# Patient Record
Sex: Male | Born: 1985 | Race: White | Hispanic: No | Marital: Single | Smoking: Current every day smoker
Health system: Southern US, Community
[De-identification: ages and names within clinical notes are randomized; demographics above are authoritative.]

## PROBLEM LIST (undated history)

## (undated) DIAGNOSIS — G2581 Restless legs syndrome: Secondary | ICD-10-CM

## (undated) DIAGNOSIS — K219 Gastro-esophageal reflux disease without esophagitis: Secondary | ICD-10-CM

## (undated) DIAGNOSIS — B1920 Unspecified viral hepatitis C without hepatic coma: Secondary | ICD-10-CM

## (undated) DIAGNOSIS — F1910 Other psychoactive substance abuse, uncomplicated: Secondary | ICD-10-CM

## (undated) HISTORY — DX: Other psychoactive substance abuse, uncomplicated: F19.10

## (undated) HISTORY — DX: Unspecified viral hepatitis C without hepatic coma: B19.20

## (undated) HISTORY — DX: Restless legs syndrome: G25.81

## (undated) HISTORY — DX: Gastro-esophageal reflux disease without esophagitis: K21.9

---
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
## 2007-01-07 ENCOUNTER — Inpatient Hospital Stay: Payer: Self-pay | Admitting: Psychiatry

## 2007-08-03 ENCOUNTER — Emergency Department: Payer: Self-pay | Admitting: Emergency Medicine

## 2007-08-18 ENCOUNTER — Emergency Department: Payer: Self-pay | Admitting: Emergency Medicine

## 2008-03-17 ENCOUNTER — Emergency Department: Payer: Self-pay | Admitting: Emergency Medicine

## 2009-04-25 ENCOUNTER — Emergency Department: Payer: Self-pay | Admitting: Emergency Medicine

## 2009-04-26 ENCOUNTER — Emergency Department: Payer: Self-pay | Admitting: Internal Medicine

## 2009-06-07 ENCOUNTER — Emergency Department: Payer: Self-pay | Admitting: Emergency Medicine

## 2009-11-11 ENCOUNTER — Emergency Department: Payer: Self-pay | Admitting: Unknown Physician Specialty

## 2010-02-01 ENCOUNTER — Emergency Department: Payer: Self-pay | Admitting: Emergency Medicine

## 2011-09-29 ENCOUNTER — Emergency Department: Payer: Self-pay | Admitting: Emergency Medicine

## 2012-01-12 ENCOUNTER — Emergency Department: Payer: Self-pay | Admitting: Emergency Medicine

## 2012-02-08 ENCOUNTER — Emergency Department: Payer: Self-pay | Admitting: Emergency Medicine

## 2012-07-05 ENCOUNTER — Emergency Department: Payer: Self-pay | Admitting: Emergency Medicine

## 2012-08-03 ENCOUNTER — Emergency Department: Payer: Self-pay | Admitting: Emergency Medicine

## 2013-12-08 ENCOUNTER — Emergency Department: Payer: Self-pay | Admitting: Emergency Medicine

## 2013-12-08 LAB — DRUG SCREEN, URINE
AMPHETAMINES, UR SCREEN: NEGATIVE (ref ?–1000)
BENZODIAZEPINE, UR SCRN: NEGATIVE (ref ?–200)
Barbiturates, Ur Screen: NEGATIVE (ref ?–200)
CANNABINOID 50 NG, UR ~~LOC~~: POSITIVE (ref ?–50)
COCAINE METABOLITE, UR ~~LOC~~: POSITIVE (ref ?–300)
MDMA (ECSTASY) UR SCREEN: NEGATIVE (ref ?–500)
Methadone, Ur Screen: NEGATIVE (ref ?–300)
Opiate, Ur Screen: POSITIVE (ref ?–300)
Phencyclidine (PCP) Ur S: NEGATIVE (ref ?–25)
Tricyclic, Ur Screen: NEGATIVE (ref ?–1000)

## 2013-12-08 LAB — CBC
HCT: 46.6 % (ref 40.0–52.0)
HGB: 16 g/dL (ref 13.0–18.0)
MCH: 32.6 pg (ref 26.0–34.0)
MCHC: 34.3 g/dL (ref 32.0–36.0)
MCV: 95 fL (ref 80–100)
Platelet: 329 10*3/uL (ref 150–440)
RBC: 4.89 10*6/uL (ref 4.40–5.90)
RDW: 12.9 % (ref 11.5–14.5)
WBC: 8.7 10*3/uL (ref 3.8–10.6)

## 2013-12-08 LAB — COMPREHENSIVE METABOLIC PANEL
ALBUMIN: 4.5 g/dL (ref 3.4–5.0)
Alkaline Phosphatase: 81 U/L
Anion Gap: 4 — ABNORMAL LOW (ref 7–16)
BUN: 11 mg/dL (ref 7–18)
Bilirubin,Total: 0.3 mg/dL (ref 0.2–1.0)
CHLORIDE: 102 mmol/L (ref 98–107)
CO2: 32 mmol/L (ref 21–32)
Calcium, Total: 8.8 mg/dL (ref 8.5–10.1)
Creatinine: 0.79 mg/dL (ref 0.60–1.30)
EGFR (African American): >60
Glucose: 96 mg/dL (ref 65–99)
Osmolality: 275 (ref 275–301)
Potassium: 4.2 mmol/L (ref 3.5–5.1)
SGOT(AST): 18 U/L (ref 15–37)
SGPT (ALT): 25 U/L (ref 12–78)
Sodium: 138 mmol/L (ref 136–145)
TOTAL PROTEIN: 7.8 g/dL (ref 6.4–8.2)

## 2013-12-08 LAB — SALICYLATE LEVEL: Salicylates, Serum: 3.4 mg/dL — ABNORMAL HIGH

## 2013-12-08 LAB — ACETAMINOPHEN LEVEL: Acetaminophen: <2 ug/mL

## 2013-12-08 LAB — TSH: Thyroid Stimulating Horm: 1.25 u[IU]/mL

## 2013-12-08 LAB — ETHANOL: Ethanol: <3 mg/dL

## 2014-02-25 ENCOUNTER — Emergency Department: Payer: Self-pay | Admitting: Emergency Medicine

## 2014-02-25 LAB — COMPREHENSIVE METABOLIC PANEL
ALBUMIN: 4 g/dL (ref 3.4–5.0)
ALT: 29 U/L (ref 12–78)
Alkaline Phosphatase: 87 U/L
Anion Gap: 8 (ref 7–16)
BILIRUBIN TOTAL: 0.4 mg/dL (ref 0.2–1.0)
BUN: 7 mg/dL (ref 7–18)
CHLORIDE: 109 mmol/L — AB (ref 98–107)
Calcium, Total: 8.6 mg/dL (ref 8.5–10.1)
Co2: 24 mmol/L (ref 21–32)
Creatinine: 0.74 mg/dL (ref 0.60–1.30)
EGFR (African American): >60
EGFR (Non-African Amer.): >60
GLUCOSE: 97 mg/dL (ref 65–99)
OSMOLALITY: 279 (ref 275–301)
POTASSIUM: 3.4 mmol/L — AB (ref 3.5–5.1)
SGOT(AST): 23 U/L (ref 15–37)
Sodium: 141 mmol/L (ref 136–145)
TOTAL PROTEIN: 7.4 g/dL (ref 6.4–8.2)

## 2014-02-25 LAB — CBC
HCT: 43.3 % (ref 40.0–52.0)
HGB: 15 g/dL (ref 13.0–18.0)
MCH: 32.2 pg (ref 26.0–34.0)
MCHC: 34.8 g/dL (ref 32.0–36.0)
MCV: 93 fL (ref 80–100)
PLATELETS: 326 10*3/uL (ref 150–440)
RBC: 4.67 10*6/uL (ref 4.40–5.90)
RDW: 12.5 % (ref 11.5–14.5)
WBC: 7.5 10*3/uL (ref 3.8–10.6)

## 2014-02-25 LAB — ETHANOL
ETHANOL LVL: 68 mg/dL
Ethanol %: 0.068 % (ref 0.000–0.080)

## 2014-02-25 LAB — ACETAMINOPHEN LEVEL: Acetaminophen: <2 ug/mL

## 2014-02-25 LAB — SALICYLATE LEVEL: Salicylates, Serum: 3.2 mg/dL — ABNORMAL HIGH

## 2014-03-16 ENCOUNTER — Emergency Department: Payer: Self-pay | Admitting: Emergency Medicine

## 2014-03-16 LAB — DRUG SCREEN, URINE
Amphetamines, Ur Screen: NEGATIVE (ref ?–1000)
BARBITURATES, UR SCREEN: NEGATIVE (ref ?–200)
Benzodiazepine, Ur Scrn: NEGATIVE (ref ?–200)
Cannabinoid 50 Ng, Ur ~~LOC~~: POSITIVE (ref ?–50)
Cocaine Metabolite,Ur ~~LOC~~: POSITIVE (ref ?–300)
MDMA (ECSTASY) UR SCREEN: NEGATIVE (ref ?–500)
Methadone, Ur Screen: NEGATIVE (ref ?–300)
Opiate, Ur Screen: POSITIVE (ref ?–300)
PHENCYCLIDINE (PCP) UR S: NEGATIVE (ref ?–25)
Tricyclic, Ur Screen: NEGATIVE (ref ?–1000)

## 2014-03-16 LAB — COMPREHENSIVE METABOLIC PANEL
ALBUMIN: 4 g/dL (ref 3.4–5.0)
ALK PHOS: 82 U/L
ALT: 56 U/L (ref 12–78)
ANION GAP: 6 — AB (ref 7–16)
BUN: 5 mg/dL — ABNORMAL LOW (ref 7–18)
Bilirubin,Total: 0.3 mg/dL (ref 0.2–1.0)
CREATININE: 1.01 mg/dL (ref 0.60–1.30)
Calcium, Total: 8.8 mg/dL (ref 8.5–10.1)
Chloride: 108 mmol/L — ABNORMAL HIGH (ref 98–107)
Co2: 27 mmol/L (ref 21–32)
EGFR (African American): >60
EGFR (Non-African Amer.): >60
Glucose: 94 mg/dL (ref 65–99)
OSMOLALITY: 278 (ref 275–301)
POTASSIUM: 4 mmol/L (ref 3.5–5.1)
SGOT(AST): 55 U/L — ABNORMAL HIGH (ref 15–37)
Sodium: 141 mmol/L (ref 136–145)
TOTAL PROTEIN: 7.4 g/dL (ref 6.4–8.2)

## 2014-03-16 LAB — URINALYSIS, COMPLETE
Bacteria: NONE SEEN
Bilirubin,UR: NEGATIVE
Blood: NEGATIVE
GLUCOSE, UR: NEGATIVE mg/dL (ref 0–75)
KETONE: NEGATIVE
Leukocyte Esterase: NEGATIVE
Nitrite: NEGATIVE
Ph: 5 (ref 4.5–8.0)
Protein: NEGATIVE
RBC,UR: 1 /HPF (ref 0–5)
Specific Gravity: 1.021 (ref 1.003–1.030)
Squamous Epithelial: <1

## 2014-03-16 LAB — CBC
HCT: 47.3 % (ref 40.0–52.0)
HGB: 15.4 g/dL (ref 13.0–18.0)
MCH: 31 pg (ref 26.0–34.0)
MCHC: 32.6 g/dL (ref 32.0–36.0)
MCV: 95 fL (ref 80–100)
Platelet: 307 10*3/uL (ref 150–440)
RBC: 4.97 10*6/uL (ref 4.40–5.90)
RDW: 12.5 % (ref 11.5–14.5)
WBC: 6.5 10*3/uL (ref 3.8–10.6)

## 2014-03-16 LAB — ETHANOL
ETHANOL LVL: 198 mg/dL
Ethanol %: 0.198 % — ABNORMAL HIGH (ref 0.000–0.080)

## 2014-03-16 LAB — SALICYLATE LEVEL: Salicylates, Serum: 4.2 mg/dL — ABNORMAL HIGH

## 2014-03-16 LAB — ACETAMINOPHEN LEVEL: Acetaminophen: <2 ug/mL

## 2014-04-10 ENCOUNTER — Emergency Department: Payer: Self-pay | Admitting: Emergency Medicine

## 2014-04-10 LAB — DRUG SCREEN, URINE
Amphetamines, Ur Screen: NEGATIVE (ref ?–1000)
BARBITURATES, UR SCREEN: NEGATIVE (ref ?–200)
BENZODIAZEPINE, UR SCRN: NEGATIVE (ref ?–200)
CANNABINOID 50 NG, UR ~~LOC~~: POSITIVE (ref ?–50)
Cocaine Metabolite,Ur ~~LOC~~: POSITIVE (ref ?–300)
MDMA (Ecstasy)Ur Screen: NEGATIVE (ref ?–500)
Methadone, Ur Screen: NEGATIVE (ref ?–300)
OPIATE, UR SCREEN: NEGATIVE (ref ?–300)
Phencyclidine (PCP) Ur S: NEGATIVE (ref ?–25)
Tricyclic, Ur Screen: NEGATIVE (ref ?–1000)

## 2014-04-10 LAB — CBC
HCT: 41 % (ref 40.0–52.0)
HGB: 13.3 g/dL (ref 13.0–18.0)
MCH: 31.3 pg (ref 26.0–34.0)
MCHC: 32.4 g/dL (ref 32.0–36.0)
MCV: 97 fL (ref 80–100)
Platelet: 248 10*3/uL (ref 150–440)
RBC: 4.25 10*6/uL — ABNORMAL LOW (ref 4.40–5.90)
RDW: 13.1 % (ref 11.5–14.5)
WBC: 5.3 10*3/uL (ref 3.8–10.6)

## 2014-04-10 LAB — COMPREHENSIVE METABOLIC PANEL
Albumin: 3.5 g/dL (ref 3.4–5.0)
Alkaline Phosphatase: 201 U/L — ABNORMAL HIGH
Anion Gap: 3 — ABNORMAL LOW (ref 7–16)
BILIRUBIN TOTAL: 0.9 mg/dL (ref 0.2–1.0)
BUN: 7 mg/dL (ref 7–18)
Calcium, Total: 8.7 mg/dL (ref 8.5–10.1)
Chloride: 99 mmol/L (ref 98–107)
Co2: 33 mmol/L — ABNORMAL HIGH (ref 21–32)
Creatinine: 0.87 mg/dL (ref 0.60–1.30)
EGFR (African American): >60
EGFR (Non-African Amer.): >60
Glucose: 128 mg/dL — ABNORMAL HIGH (ref 65–99)
OSMOLALITY: 270 (ref 275–301)
Potassium: 4.4 mmol/L (ref 3.5–5.1)
SGOT(AST): 661 U/L — ABNORMAL HIGH (ref 15–37)
SGPT (ALT): 1365 U/L — ABNORMAL HIGH (ref 12–78)
Sodium: 135 mmol/L — ABNORMAL LOW (ref 136–145)
TOTAL PROTEIN: 6.3 g/dL — AB (ref 6.4–8.2)

## 2014-04-10 LAB — URINALYSIS, COMPLETE
Bacteria: NONE SEEN
Bilirubin,UR: NEGATIVE
Glucose,UR: NEGATIVE mg/dL (ref 0–75)
Ketone: NEGATIVE
LEUKOCYTE ESTERASE: NEGATIVE
NITRITE: NEGATIVE
Ph: 7 (ref 4.5–8.0)
Protein: NEGATIVE
SPECIFIC GRAVITY: 1.012 (ref 1.003–1.030)
Squamous Epithelial: NONE SEEN
WBC UR: 1 /HPF (ref 0–5)

## 2014-04-10 LAB — SALICYLATE LEVEL

## 2014-04-10 LAB — ACETAMINOPHEN LEVEL: Acetaminophen: <2 ug/mL

## 2014-04-10 LAB — ETHANOL

## 2014-05-18 LAB — BASIC METABOLIC PANEL
BUN: 6 mg/dL (ref 4–21)
Creatinine: 0.9 mg/dL (ref 0.6–1.3)
GLUCOSE: 91 mg/dL
Sodium: 141 mmol/L (ref 137–147)

## 2014-05-18 LAB — LIPID PANEL
CHOLESTEROL: 133 mg/dL (ref 0–200)
HDL: 58 mg/dL (ref 35–70)
LDL CALC: 59 mg/dL
Triglycerides: 78 mg/dL (ref 40–160)

## 2014-05-18 LAB — HEMOGLOBIN A1C: HEMOGLOBIN A1C: 5.6

## 2014-05-18 LAB — CBC AND DIFFERENTIAL
NEUTROS ABS: 4 /uL
WBC: 6.6 10*3/mL

## 2014-05-18 LAB — TSH: TSH: 1.07 u[IU]/mL (ref 0.41–5.90)

## 2014-06-27 LAB — HEPATIC FUNCTION PANEL
ALT: 100 U/L — AB (ref 10–40)
AST: 69 U/L — AB (ref 14–40)
Alkaline Phosphatase: 77 U/L (ref 25–125)
Bilirubin, Direct: 0.11 mg/dL (ref 0.01–0.4)
Bilirubin, Total: 0.3 mg/dL

## 2014-11-03 ENCOUNTER — Emergency Department: Payer: Self-pay | Admitting: Emergency Medicine

## 2014-11-03 LAB — SALICYLATE LEVEL: SALICYLATES, SERUM: 3.9 mg/dL — AB

## 2014-11-03 LAB — COMPREHENSIVE METABOLIC PANEL
Albumin: 4.4 g/dL (ref 3.4–5.0)
Alkaline Phosphatase: 91 U/L
Anion Gap: 7 (ref 7–16)
BUN: 9 mg/dL (ref 7–18)
Bilirubin,Total: 0.3 mg/dL (ref 0.2–1.0)
Calcium, Total: 8.6 mg/dL (ref 8.5–10.1)
Chloride: 104 mmol/L (ref 98–107)
Co2: 26 mmol/L (ref 21–32)
Creatinine: 0.83 mg/dL (ref 0.60–1.30)
EGFR (African American): >60
Glucose: 92 mg/dL (ref 65–99)
Osmolality: 272 (ref 275–301)
POTASSIUM: 3.7 mmol/L (ref 3.5–5.1)
SGOT(AST): 95 U/L — ABNORMAL HIGH (ref 15–37)
SGPT (ALT): 255 U/L — ABNORMAL HIGH
Sodium: 137 mmol/L (ref 136–145)
Total Protein: 8 g/dL (ref 6.4–8.2)

## 2014-11-03 LAB — DRUG SCREEN, URINE
AMPHETAMINES, UR SCREEN: NEGATIVE (ref ?–1000)
Barbiturates, Ur Screen: NEGATIVE (ref ?–200)
Benzodiazepine, Ur Scrn: NEGATIVE (ref ?–200)
Cannabinoid 50 Ng, Ur ~~LOC~~: POSITIVE (ref ?–50)
Cocaine Metabolite,Ur ~~LOC~~: POSITIVE (ref ?–300)
MDMA (Ecstasy)Ur Screen: NEGATIVE (ref ?–500)
METHADONE, UR SCREEN: NEGATIVE (ref ?–300)
OPIATE, UR SCREEN: NEGATIVE (ref ?–300)
Phencyclidine (PCP) Ur S: NEGATIVE (ref ?–25)
Tricyclic, Ur Screen: NEGATIVE (ref ?–1000)

## 2014-11-03 LAB — ACETAMINOPHEN LEVEL

## 2014-11-03 LAB — CBC
HCT: 44.1 % (ref 40.0–52.0)
HGB: 15.1 g/dL (ref 13.0–18.0)
MCH: 32 pg (ref 26.0–34.0)
MCHC: 34.1 g/dL (ref 32.0–36.0)
MCV: 94 fL (ref 80–100)
PLATELETS: 312 10*3/uL (ref 150–440)
RBC: 4.71 10*6/uL (ref 4.40–5.90)
RDW: 12.3 % (ref 11.5–14.5)
WBC: 9 10*3/uL (ref 3.8–10.6)

## 2014-11-03 LAB — URINALYSIS, COMPLETE
BLOOD: NEGATIVE
Bacteria: NONE SEEN
Bilirubin,UR: NEGATIVE
Glucose,UR: NEGATIVE mg/dL (ref 0–75)
Ketone: NEGATIVE
LEUKOCYTE ESTERASE: NEGATIVE
Nitrite: NEGATIVE
PH: 6 (ref 4.5–8.0)
PROTEIN: NEGATIVE
RBC, UR: NONE SEEN /HPF (ref 0–5)
Specific Gravity: 1.001 (ref 1.003–1.030)
Squamous Epithelial: <1
WBC UR: <1 /HPF (ref 0–5)

## 2014-11-03 LAB — ETHANOL
Ethanol: 101 mg/dL
Ethanol: <3 mg/dL

## 2014-12-14 IMAGING — CT CT HEAD WITHOUT CONTRAST
2 series · 16 of 30 positions shown, 20 images · non-contrast
Comparison: None available for direct review. Report of 02/08/2012
head CT is available.

CLINICAL DATA: Altered mental status. Decreased respirations after
drug use. Left temporal trauma.

EXAM:
CT HEAD WITHOUT CONTRAST
TECHNIQUE: Contiguous axial images were obtained from the base of the skull
through the vertex without intravenous contrast.

[Series 2: head wo · axial · 0.42mm/px · z∈[+388,+518]mm · 14 of 31 slices shown, 18 images]
[im 3/31  brain]
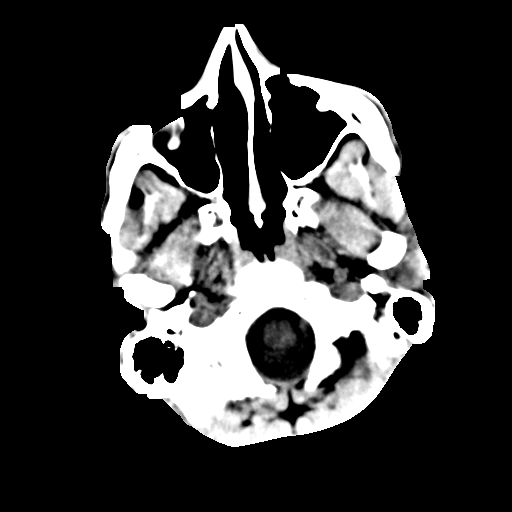
[im 3/31  bone]
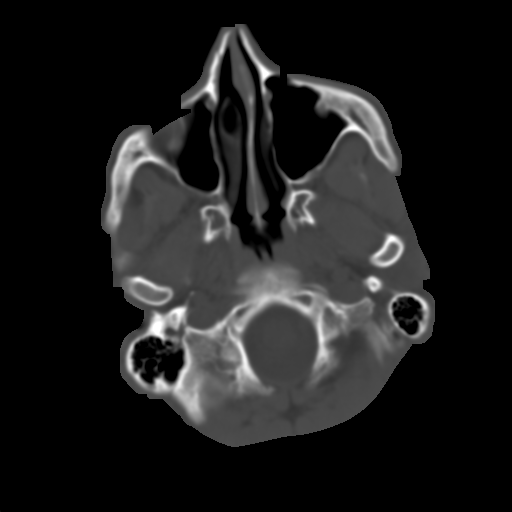
[im 5/31  brain]
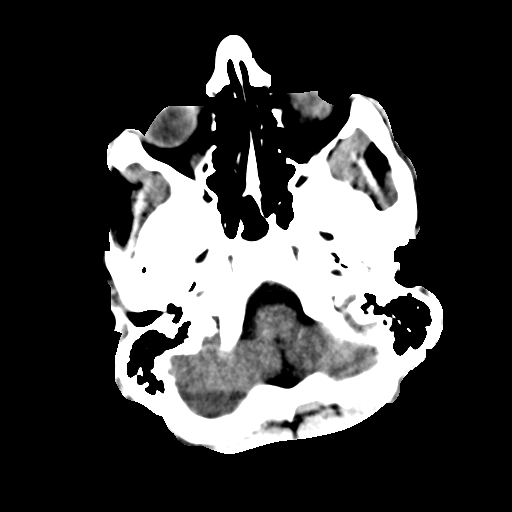
[im 7/31  brain]
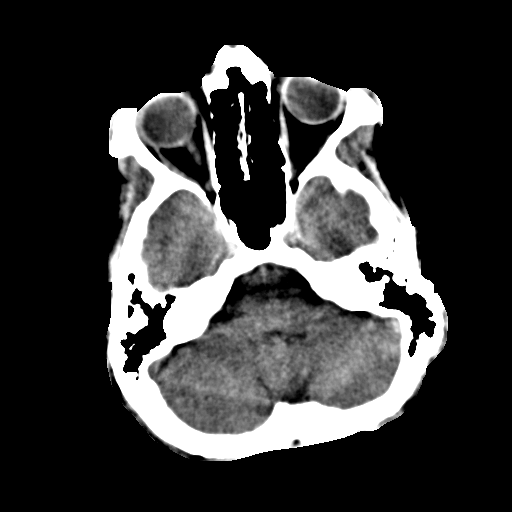
[im 9/31  brain]
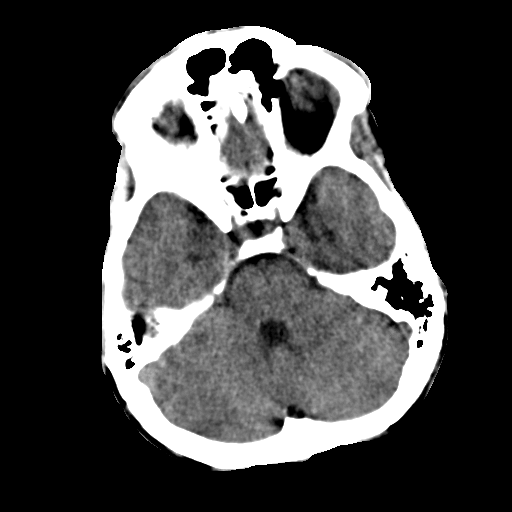
[im 11/31  brain]
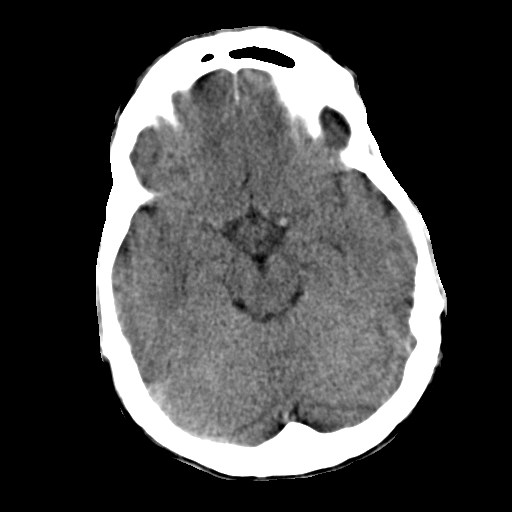
[im 11/31  bone]
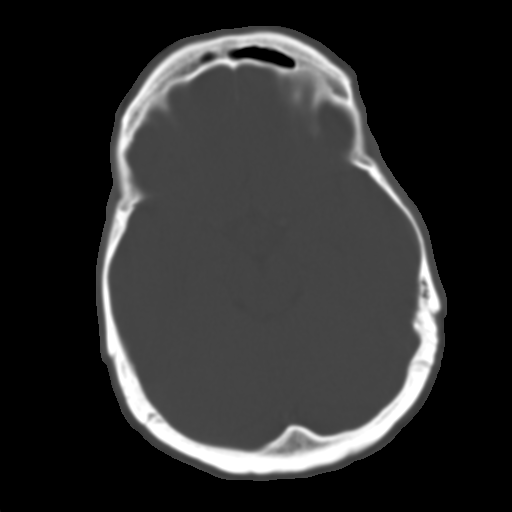
[im 13/31  brain]
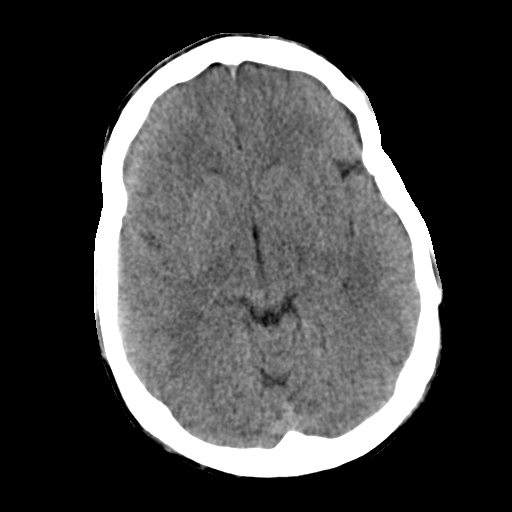
[im 15/31  brain]
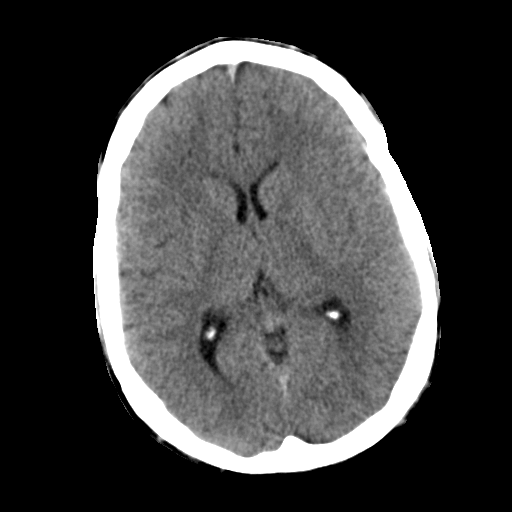
[im 17/31  brain]
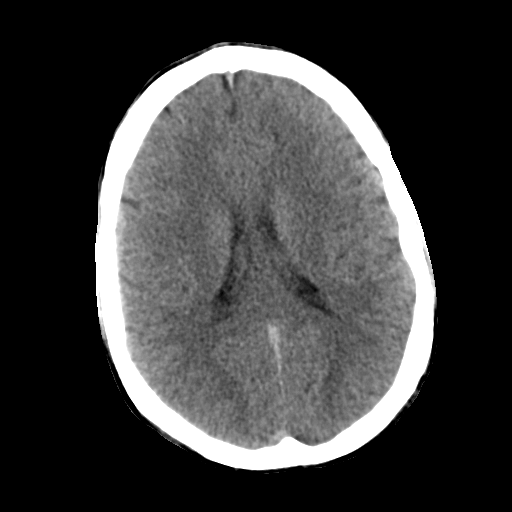
[im 19/31  brain]
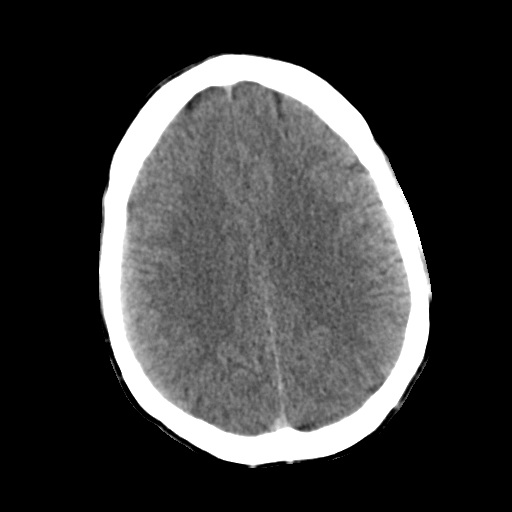
[im 19/31  bone]
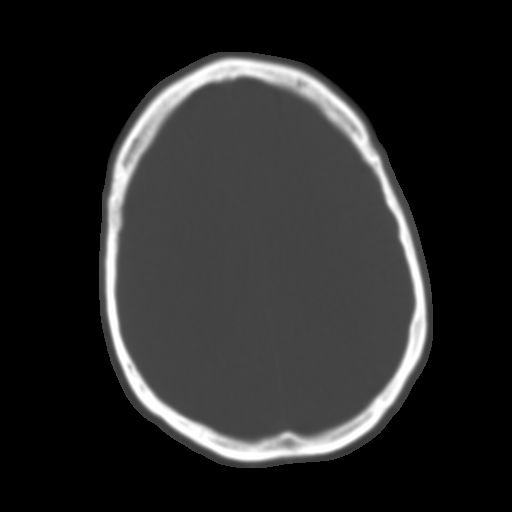
[im 21/31  brain]
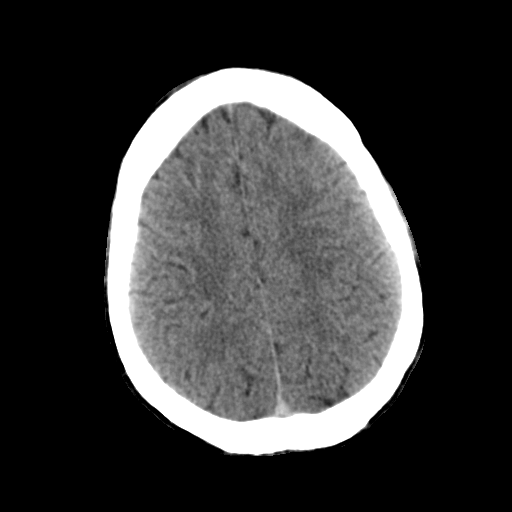
[im 23/31  brain]
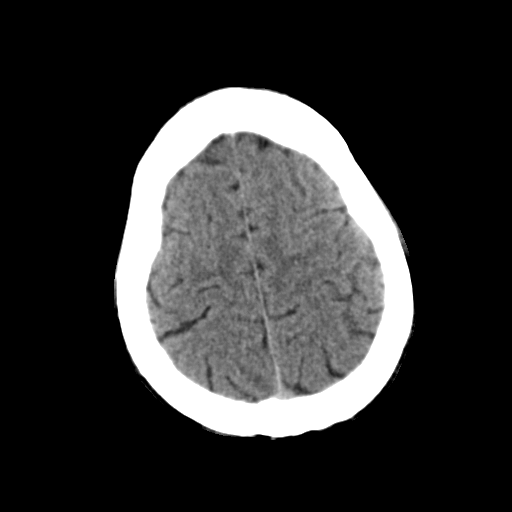
[im 25/31  brain]
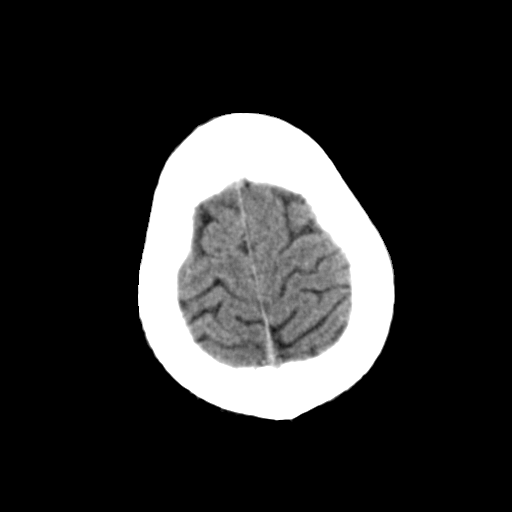
[im 27/31  brain]
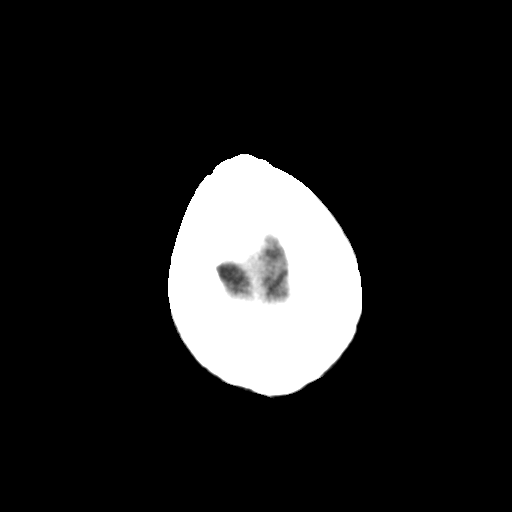
[im 27/31  bone]
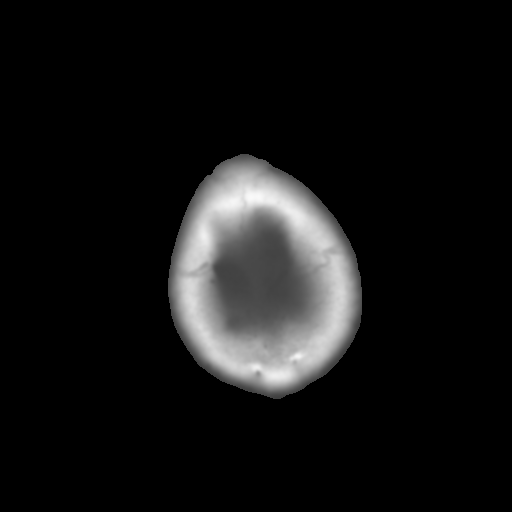
[im 29/31  brain]
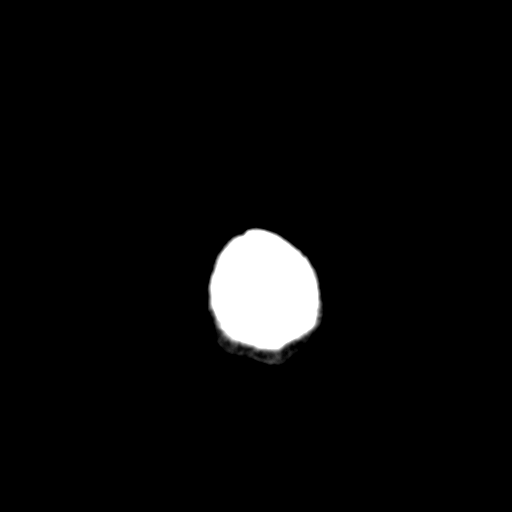

[Series 4: head wo recons · axial · 0.42mm/px · z∈[+433,+451]mm · 2 of 28 slices shown]
[im 3/28  brain]
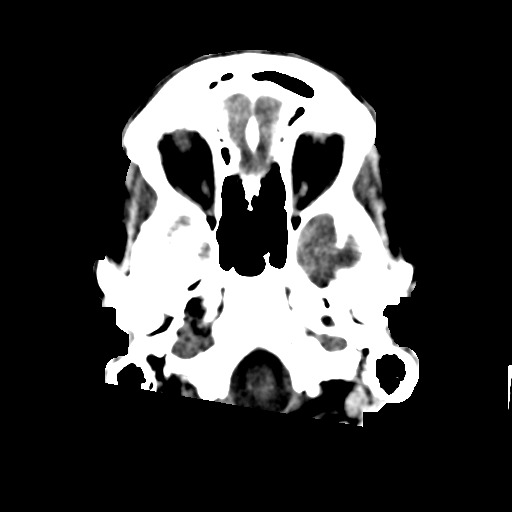
[im 7/28  brain]
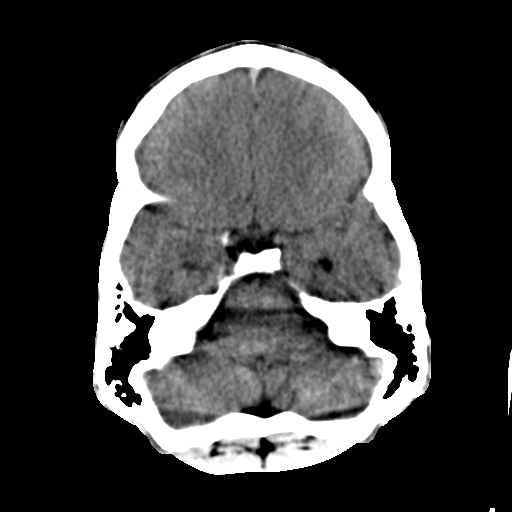

[16 of 30 positions shown; findings below may reference images not displayed]

FINDINGS: No skull fracture or intracranial hemorrhage.

No CT evidence of large acute infarct or diffuse anoxia.

No hydrocephalus.

No intracranial mass lesion noted on this unenhanced exam.
IMPRESSION: No skull fracture or intracranial hemorrhage.

No CT evidence of large acute infarct or diffuse anoxia.

## 2014-12-17 ENCOUNTER — Emergency Department: Payer: Self-pay | Admitting: Emergency Medicine

## 2014-12-19 ENCOUNTER — Emergency Department: Payer: Self-pay | Admitting: Emergency Medicine

## 2014-12-28 ENCOUNTER — Emergency Department: Payer: Self-pay | Admitting: Emergency Medicine

## 2015-02-24 NOTE — Consult Note (Signed)
PATIENT NAME:  Steven Stark, Steven Stark MR#:  [id number] DATE OF BIRTH:  10-07-1986  DATE OF CONSULTATION:  03/16/2014  CONSULTING PHYSICIAN:  Lemya Greenwell S. Garnetta Buddy, MD  REASON FOR CONSULTATION:  Alcohol intoxication.   HISTORY OF PRESENT ILLNESS:  The patient is a 29 year old male who presented to the ER as his friends called 911 after they had concerns about his breathing. They were afraid that the patient was going to die. The patient reported that he was using a lot of alcohol and was consuming approximately 40 ounce as well as 6 packs of beer. He reported that he was asleep and the friend woke him up and he was in the police car. He stated "that I could not breathe, but I was okay." He stated that he uses alcohol as well as cocaine and marijuana on a regular basis. He stated that he was using approximately 1 gram of cocaine IV, as well as marijuana on a daily basis. He has been using cocaine for the past 1 year. The patient reported that "I was not breathing right" and that was the reason his friends were concerned. The patient reported he does not remember that medications were administered to him last night and he was sleeping for the whole day. 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 He reported that he does not have any auditory or visual hallucinations at this time. He reported that his older brother, who is in his 30s, also went to the [facility] recently as he was using any drugs at that time. He stated that he does not feel that he is going into withdrawals and became agitated when he heard about plans to  be sent to [facility] at this time. He currently denied having any perceptual disturbances.   PAST PSYCHIATRIC HISTORY:  The patient reported that he went to the [facility] for rehab at least 2 times in the past. He reported that he is not interested in going to the drug rehab program at this time. He stated that he does  not take any psychotropic medications.   PAST MEDICAL HISTORY:  The patient currently denies having any acute medical problems.   ALLERGIES:  No known drug allergies.   FAMILY HISTORY:  The patient stated strong family history of alcoholism in his family, including his parents, as well as his older brother. He reported that there is no family history of suicide attempts in the family.   CURRENT MEDICATIONS:   None.  SUBSTANCE ABUSE HISTORY:  The patient reported that he has been drinking since he was a teenager. He usually consumes 40 ounces on a daily basis. He also consumes beer along with that. He denied any history of seizures or blackouts. He also mentioned about use of cocaine since he was 17. He usually consumes a gram on a daily basis. He also uses cocaine on an IV basis. He also started using marijuana at the age of 64. He reported consuming approximately a gram of marijuana. He stated that he has tried other drugs in the past.   SOCIAL HISTORY:  The patient reported that he currently lives by himself and works as an Personnel officer at [employer]. He has been working there for the past 6 months. He denied any pending legal charges.   REVIEW OF SYSTEMS:  CONSTITUTIONAL:  Denied any fever or chills. No weight changes.  EYES:  No double or  blurred vision.  RESPIRATORY:  No shortness of breath or cough.  CARDIOVASCULAR:  No chest pain or orthopnea.  GASTROINTESTINAL:  No abdominal pain, nausea, vomiting or diarrhea.  GENITOURINARY:  No incontinence or frequency.  ENDOCRINE: No heat or cold intolerance.  LYMPHATIC:  No anemia or easy bruising.  INTEGUMENTARY:  No acne or rash.  MUSCULOSKELETAL:  No muscle or joint pain.  NEUROLOGIC:  No tingling or weakness.   PHYSICAL EXAMINATION:  VITAL SIGNS: Temperature 96.6, pulse 78, respirations 18, blood pressure 121/68.   LABORATORY DATA:  Glucose 94, BUN 5, creatinine 1.01, sodium 141, potassium 4.0, chloride 108, bicarbonate 27, anion  gap 6, osmolality 278. Blood alcohol level 198. Protein 7.4, albumin 4.0, alkaline phosphatase 82, AST 55, AST 56. UDS positive for cocaine, cannabinoids. WBC 6.5, RBC 4.97, hemoglobin 15.4. Hematocrit is 47.3. Platelet count is 307. MCV is 95. RDW is 12.5.   MENTAL STATUS EXAMINATION:  The patient is a thinly-built male who appeared his stated age. He has a thin body habitus and appears well nourished. His muscle tone appears normal. Gait and station within normal limits. Speech was slow initially and became pressured later on. Thought process was tangential. Loses his patience was noted. Thought content was nondelusional. He denied having any suicidal ideations or plans. Judgment and insight were poor regarding his use of drugs and alcohol. He was awake, alert and oriented x 3. Recent and remote memory were intact. Attention span and concentration were short. Fund of knowledge and language appropriate. Mood was anxious and affect was congruent.   DIAGNOSTIC IMPRESSION: AXIS I: 1.  Bipolar disorder, not otherwise specified.  2.  Polysubstance dependence.  3.  Alcohol withdrawal.  AXIS II:  None.  AXIS III:  None reported.   TREATMENT PLAN: 1.  The patient is currently under involuntary commitment, and he will be given injections of Haldol 5 mg, Ativan 2 mg  at this time, as he became agitated when he heard about his admission to the [facility] and has to be controlled by the security.  2.  Patient will be started on Librium 25 mg p.o. q.6 hours on a regular basis for alcohol withdrawal symptoms.  3.  When he becomes clinically stable, he will be transferred to [facility] for continued care.   Thank you for allowing me to participate in the care of this patient.   ____________________________ Ardeen FillersUzma S. Garnetta BuddyFaheem, MD usf:dmm D: 03/16/2014 16:01:19 ET T: 03/16/2014 17:07:22 ET JOB#: [id number]  cc: Ardeen FillersUzma S. Garnetta BuddyFaheem, MD, <Dictator> Rhunette CroftUZMA S Athea Haley MD ELECTRONICALLY SIGNED 03/23/2014 12:50

## 2015-03-04 NOTE — Consult Note (Signed)
PATIENT NAME:  Steven Stark, Steven Stark MR#:  [id number] DATE OF BIRTH:  [birth year]  DATE OF CONSULTATION:  12/20/2014  REFERRING PHYSICIAN:   CONSULTING PHYSICIAN:  Audery Amel, MD  IDENTIFYING INFORMATION AND REASON FOR CONSULTATION: A 29 year old male with a history of opiate abuse as well as alcohol and marijuana abuse.   CHIEF COMPLAINT: "I already detoxed."   HISTORY OF PRESENT ILLNESS: Information obtained from the patient and the chart. According to the chart, patient came into the hospital very agitated last night trying to elope, stating that he needed Subutex. The patient tells me today that his family had him petitioned and brought over here, but he did not think he needed to be here because he had already detoxed off of drugs. He said he had been here the day before and wanted detoxification and had been turned away but had already stopped using cocaine and heroin. He currently denies any acute symptoms of withdrawal. Denies feeling sick. No shakiness, no aches, no nausea, no GI complaints. He denies any mood symptoms. He says he is not feeling depressed. Sleeps okay appetite is okay. Optimistic about the future, denies suicidal or homicidal ideation. He admits that he had been using heroin, but has not used any heroin or cocaine for 3 days now.   PAST PSYCHIATRIC HISTORY: No history of suicidal or homicidal behavior. Not on any psychiatric medication. He has had substance abuse problems since he was an adolescent, been to several inpatient rehabilitation programs. Says he has been able to maintain some sobriety up to 6 months last year. Was previously been seen at [facility] the [facility] but dropped out because he could not afford it and could not continue to make the meetings.   FAMILY HISTORY: No known family history.   SOCIAL HISTORY: Staying with a friend, works intermittently, looking for work now.   PAST MEDICAL HISTORY: No known medical problems, although he has been tested  positive for hepatitis C, he has not pursued any treatment yet.   CURRENT MEDICATIONS: None.   ALLERGIES: No known drug allergies.   REVIEW OF SYSTEMS: Denies depression, denies suicidal or homicidal ideation; denies hallucinations, no GI, pulmonary or cardiac symptoms.   MENTAL STATUS EXAMINATION: Neatly groomed gentleman who looks his stated age, cooperative with the interview. Eye contact good. Psychomotor activity normal. Speech normal rate, tone and volume. Affect euthymic, reactive, mood stated as okay. Thoughts are lucid without loosening of associations. Denies auditory or visual hallucinations. Denies suicidal or homicidal ideation. He is alert and oriented x 4. Remembers 3/3 objects immediately and 3/3 at 3 minutes. Judgment and insight adequate. Intelligence normal.   LABORATORY RESULTS: Chemistry panel, elevated ALT 173, elevated AST 79, alcohol negative, acetaminophen negative, salicylates slightly elevated but not toxic; CBC normal, urinalysis positive for protein, drug screen positive for cocaine and opiates, alcohol was 143 on February 14, that was the last time he was here in the hospital.   VITAL SIGNS: Blood pressure currently 128/65, respirations 18, pulse 88, temperature 98.9.   ASSESSMENT: A 29 year old male with polysubstance dependence. Currently he appears to be lucid, calm, emotionally stable, no suicidal or homicidal ideation. No acute dangerous behavior. The patient understands the dangers of his substance abuse problem, does not meet commitment criteria.   TREATMENT PLAN: He can be released from commitment. He will be encouraged to go back to [place] and follow up and to go to narcotics anonymous meetings. He agrees to the plan. Psychoeducation completed.   DIAGNOSIS, PRINCIPAL  AND PRIMARY:   Axis I:  Opiate abuse, severe; cocaine abuse, severe; alcohol abuse, severe.   AXIS II: Deferred.   AXIS III:  Hepatitis C.    ____________________________ Audery AmelJohn T.  Ailed Defibaugh, MD jtc:nt D: 12/20/2014 15:53:25 ET T: 12/20/2014 16:09:39 ET JOB#: [id number]  cc: Audery AmelJohn T. Raistlin Gum, MD, <Dictator> Audery AmelJOHN T Liban Guedes MD ELECTRONICALLY SIGNED 01/09/2015 10:33

## 2015-11-13 ENCOUNTER — Emergency Department
Admission: EM | Admit: 2015-11-13 | Discharge: 2015-11-13 | Disposition: A | Discharge status: Home / Self Care | Payer: Self-pay | Attending: Emergency Medicine | Admitting: Emergency Medicine

## 2015-11-13 ENCOUNTER — Encounter: Payer: Self-pay | Admitting: Emergency Medicine

## 2015-11-13 ENCOUNTER — Emergency Department: Payer: Self-pay

## 2015-11-13 DIAGNOSIS — R220 Localized swelling, mass and lump, head: Secondary | ICD-10-CM | POA: Insufficient documentation

## 2015-11-13 DIAGNOSIS — K047 Periapical abscess without sinus: Secondary | ICD-10-CM | POA: Insufficient documentation

## 2015-11-13 DIAGNOSIS — F17200 Nicotine dependence, unspecified, uncomplicated: Secondary | ICD-10-CM | POA: Insufficient documentation

## 2015-11-13 LAB — CBC
HCT: 40.1 % (ref 40.0–52.0)
HEMOGLOBIN: 13.6 g/dL (ref 13.0–18.0)
MCH: 30.6 pg (ref 26.0–34.0)
MCHC: 34 g/dL (ref 32.0–36.0)
MCV: 90 fL (ref 80.0–100.0)
Platelets: 176 10*3/uL (ref 150–440)
RBC: 4.45 MIL/uL (ref 4.40–5.90)
RDW: 12.7 % (ref 11.5–14.5)
WBC: 11.6 10*3/uL — ABNORMAL HIGH (ref 3.8–10.6)

## 2015-11-13 LAB — BASIC METABOLIC PANEL
Anion gap: 10 (ref 5–15)
BUN: 11 mg/dL (ref 6–20)
CHLORIDE: 102 mmol/L (ref 101–111)
CO2: 26 mmol/L (ref 22–32)
CREATININE: 0.85 mg/dL (ref 0.61–1.24)
Calcium: 9.3 mg/dL (ref 8.9–10.3)
GFR calc Af Amer: >60 mL/min (ref >60)
GFR calc non Af Amer: >60 mL/min (ref >60)
Glucose, Bld: 157 mg/dL — ABNORMAL HIGH (ref 65–99)
Potassium: 4.6 mmol/L (ref 3.5–5.1)
SODIUM: 138 mmol/L (ref 135–145)

## 2015-11-13 MED ORDER — BUPIVACAINE HCL 0.5 % IJ SOLN: 50.0000 mL | Freq: Once | INTRAMUSCULAR | Status: DC

## 2015-11-13 MED ORDER — OXYCODONE-ACETAMINOPHEN 5-325 MG PO TABS
2.0000 | ORAL_TABLET | Freq: Once | ORAL | Status: AC
  Administered 2015-11-13: 2 via ORAL
  Filled 2015-11-13: qty 2

## 2015-11-13 MED ORDER — CLINDAMYCIN PHOSPHATE 600 MG/50ML IV SOLN
600.0000 mg | Freq: Once | INTRAVENOUS | Status: AC
  Administered 2015-11-13: 600 mg via INTRAVENOUS
  Filled 2015-11-13: qty 50

## 2015-11-13 MED ORDER — BUPIVACAINE HCL (PF) 0.5 % IJ SOLN
INTRAMUSCULAR | Status: AC
  Administered 2015-11-13: 10 mL
  Filled 2015-11-13: qty 30

## 2015-11-13 MED ORDER — BUPIVACAINE HCL 0.5 % IJ SOLN
30.0000 mL | Freq: Once | INTRAMUSCULAR | Status: AC
  Administered 2015-11-13: 10 mL

## 2015-11-13 MED ORDER — MORPHINE SULFATE (PF) 4 MG/ML IV SOLN
4.0000 mg | Freq: Once | INTRAVENOUS | Status: AC
  Administered 2015-11-13: 4 mg via INTRAVENOUS
  Filled 2015-11-13: qty 1

## 2015-11-13 MED ORDER — CLINDAMYCIN HCL 300 MG PO CAPS: 300.0000 mg | ORAL_CAPSULE | Freq: Three times a day (TID) | ORAL | Status: DC

## 2015-11-13 MED ORDER — CLINDAMYCIN PHOSPHATE 600 MG/50ML IV SOLN
INTRAVENOUS | Status: AC
  Administered 2015-11-13: 600 mg via INTRAVENOUS
  Filled 2015-11-13: qty 50

## 2015-11-13 MED ORDER — IOHEXOL 300 MG/ML  SOLN
75.0000 mL | Freq: Once | INTRAMUSCULAR | Status: AC | PRN
  Administered 2015-11-13: 75 mL via INTRAVENOUS
  Filled 2015-11-13: qty 75

## 2015-11-13 MED ORDER — OXYCODONE-ACETAMINOPHEN 5-325 MG PO TABS: 1.0000 | ORAL_TABLET | Freq: Four times a day (QID) | ORAL | Status: DC | PRN

## 2015-11-13 NOTE — ED Notes (Signed)
Patient transported to CT 

## 2015-11-13 NOTE — ED Notes (Signed)
Pt to ed with c/o right side facial swelling x 2 days.  Pt reports toothache prior to swelling.

## 2015-11-13 NOTE — Discharge Instructions (Signed)
Dental Abscess °A dental abscess is a collection of pus in or around a tooth. °CAUSES °This condition is caused by a bacterial infection around the root of the tooth that involves the inner part of the tooth (pulp). It may result from: °· Severe tooth decay. °· Trauma to the tooth that allows bacteria to enter into the pulp, such as a broken or chipped tooth. °· Severe gum disease around a tooth. °SYMPTOMS °Symptoms of this condition include: °· Severe pain in and around the infected tooth. °· Swelling and redness around the infected tooth, in the mouth, or in the face. °· Tenderness. °· Pus drainage. °· Bad breath. °· Bitter taste in the mouth. °· Difficulty swallowing. °· Difficulty opening the mouth. °· Nausea. °· Vomiting. °· Chills. °· Swollen neck glands. °· Fever. °DIAGNOSIS °This condition is diagnosed with examination of the infected tooth. During the exam, your dentist may tap on the infected tooth. Your dentist will also ask about your medical and dental history and may order X-rays. °TREATMENT °This condition is treated by eliminating the infection. This may be done with: °· Antibiotic medicine. °· A root canal. This may be performed to save the tooth. °· Pulling (extracting) the tooth. This may also involve draining the abscess. This is done if the tooth cannot be saved. °HOME CARE INSTRUCTIONS °· Take medicines only as directed by your dentist. °· If you were prescribed antibiotic medicine, finish all of it even if you start to feel better. °· Rinse your mouth (gargle) often with salt water to relieve pain or swelling. °· Do not drive or operate heavy machinery while taking pain medicine. °· Do not apply heat to the outside of your mouth. °· Keep all follow-up visits as directed by your dentist. This is important. °SEEK MEDICAL CARE IF: °· Your pain is worse and is not helped by medicine. °SEEK IMMEDIATE MEDICAL CARE IF: °· You have a fever or chills. °· Your symptoms suddenly get worse. °· You have a  very bad headache. °· You have problems breathing or swallowing. °· You have trouble opening your mouth. °· You have swelling in your neck or around your eye. °  °This information is not intended to replace advice given to you by your health care provider. Make sure you discuss any questions you have with your health care provider. °  °Document Released: 10/20/2005 Document Revised: 03/06/2015 Document Reviewed: 10/17/2014 °Elsevier Interactive Patient Education ©2016 [employer] ° ° ° ° ° ° °OPTIONS FOR DENTAL FOLLOW UP CARE ° °[place] Department of Health and Human Services - Local Safety Net Dental Clinics °http://www.ncdhhs.gov/dph/oralhealth/services/safetynetclinics.htm °  °[facility] (336-562-3123) ° °[facility] (919-933-9087) ° °[facility] (919-663-1744 ext 237) ° °[facility] (336-570-6415) ° °[facility] (919-968-2025) °This clinic caters to the indigent population and is on a lottery system. °Location: °[facility], [address], [place] °Clinic Hours: °Wednesdays from 6pm - 9pm, patients seen by a lottery system. °For dates, call or go to www.med.[facility].edu/[facility]/patients/Dental-[facility] °Services: °Cleanings, fillings and simple extractions. °Payment Options: °DENTAL WORK IS FREE OF CHARGE. Bring proof of income or support. °Best way to get seen: °Arrive at 5:15 pm - this is a lottery, NOT first come/first serve, so arriving earlier will not increase your chances of being seen. °  °  °[facility] °919-537-3737 °Select option 1 for emergencies °  °Location: °[facility], [address], [place] °  Clinic Hours: °No walk-ins accepted - call the day before to schedule an appointment. °Check in times are 9:30 am and 1:30 pm. °Services: °Simple extractions, temporary fillings, pulpectomy/pulp debridement, uncomplicated abscess drainage. °Payment Options: °PAYMENT IS  DUE AT THE TIME OF SERVICE.  Fee is usually $100-200, additional surgical procedures (e.g. abscess drainage) may be extra. °Cash, checks, Visa/MasterCard accepted.  Can file Medicaid if patient is covered for dental - patient should call case worker to check. °No discount for [facility] patients. °Best way to get seen: °MUST call the day before and get onto the schedule. Can usually be seen the next 1-2 days. No walk-ins accepted. °  °  °[facility] °919-933-9087 °  °Location: °[facility], [address], [place] °Clinic Hours: °M, W, Th, F 8am or 1:30pm, Tues 9a or 1:30 - first come/first served. °Services: °Simple extractions, temporary fillings, uncomplicated abscess drainage.  You do not need to be an [place] resident. °Payment Options: °PAYMENT IS DUE AT THE TIME OF SERVICE. °Dental insurance, otherwise sliding scale - bring proof of income or support. °Depending on income and treatment needed, cost is usually $50-200. °Best way to get seen: °Arrive early as it is first come/first served. °  °  °[facility] °919-542-1641 °  °Location: °[address] °Clinic Hours: °Mon-Thu 8a-5p °Services: °Most basic dental services including extractions and fillings. °Payment Options: °PAYMENT IS DUE AT THE TIME OF SERVICE. °Sliding scale, up to 50% off - bring proof if income or support. °Medicaid with dental option accepted. °Best way to get seen: °Call to schedule an appointment, can usually be seen within 2 weeks OR they will try to see walk-ins - show up at 8a or 2p (you may have to wait). °  °  °[facility] °919-245-2435 °[place] RESIDENTS ONLY °  °Location: °[facility], [address], [place], [place] [address] °Clinic Hours: By appointment only. °Monday - Thursday 8am-5pm, Friday 8am-12pm °Services: Cleanings, fillings, extractions. °Payment Options: °PAYMENT IS DUE AT THE TIME OF  SERVICE. °Cash, Visa or MasterCard. Sliding scale - $30 minimum per service. °Best way to get seen: °Come in to office, complete packet and make an appointment - need proof of income °or support monies for each household member and proof of [place] residence. °Usually takes about a month to get in. °  °  °[facility] °[phone] °  °Location: °[address] [place] °Clinic Hours: Walk-in Urgent Care Dental Services are offered Monday-Friday mornings only. °The numbers of emergencies accepted daily is limited to the number of °providers available. °Maximum 15 - Mondays, Wednesdays & Thursdays °Maximum 10 - Tuesdays & Fridays °Services: °You do not need to be a [place] resident to be seen for a dental emergency. °Emergencies are defined as pain, swelling, abnormal bleeding, or dental trauma. Walkins will receive x-rays if needed. °NOTE: Dental cleaning is not an emergency. °Payment Options: °PAYMENT IS DUE AT THE TIME OF SERVICE. °Minimum co-pay is $40.00 for uninsured patients. °Minimum co-pay is $3.00 for Medicaid with dental coverage. °Dental Insurance is accepted and must be presented at time of visit. °Medicare does not cover dental. °Forms of payment: Cash, credit card, checks. °Best way to get seen: °If not previously registered with the clinic, walk-in dental registration begins at 7:15 am and is on a first come/first serve basis. °If previously registered with the clinic, call to make an appointment. °  °  °  [facility] °919-776-4359 °[place] RESIDENTS ONLY °  °Location: °[address], [place], [place] °Clinic Hours: °Mon-Thu 10a-2p °Services: Extractions only! °Payment Options: °FREE (donations accepted) - bring proof of income or support °Best way to get seen: °Call and schedule an appointment OR come at 8am on the 1st Monday of every month (except for holidays) when it is first come/first served. °  °  °[facility] °[phone] °   °Location: °[address], [place] °Clinic Hours: °Friday mornings °Services, Payment Options, Best way to get seen: °Call for info °

## 2015-11-13 NOTE — ED Notes (Signed)
Pt with lower right tooth # 2 from back broken off below gum line. Gum, right jaw swollen, 10/10 pain.

## 2015-11-13 NOTE — ED Provider Notes (Signed)
[facility] Emergency Department Provider Note  ____________________________________________   I have reviewed the triage vital signs and the nursing notes.   HISTORY  Chief Complaint Facial Swelling    HPI Steven Stark is a 30 y.o. male resents today with dental pain and swelling now to his right face. He has not no difficulty breathing or swelling underneath his jaw. Denies fever.Patient is a history of IV cocaine use but he has been clean for the last 2 months denies IV heroin.  History reviewed. No pertinent past medical history.  There are no active problems to display for this patient.   History reviewed. No pertinent past surgical history.  No current outpatient prescriptions on file.  Allergies Review of patient's allergies indicates no known allergies.  History reviewed. No pertinent family history.  Social History Social History  Substance Use Topics  . Smoking status: Current Every Day Smoker  . Smokeless tobacco: None  . Alcohol Use: Yes    Review of Systems Constitutional: No fever/chills Eyes: No visual changes. ENT: No sore throat. No stiff neck no neck pain Cardiovascular: Denies chest pain. Respiratory: Denies shortness of breath. Gastrointestinal:   no vomiting.  No diarrhea.  No constipation. Genitourinary: Negative for dysuria. Musculoskeletal: Negative lower extremity swelling Skin: Negative for rash. Neurological: Negative for headaches, focal weakness or numbness. 10-point ROS otherwise negative.  ____________________________________________   PHYSICAL EXAM:  VITAL SIGNS: ED Triage Vitals  Enc Vitals Group     BP 11/13/15 1233 150/81 mmHg     Pulse Rate 11/13/15 1233 102     Resp 11/13/15 1233 18     Temp 11/13/15 1233 97.7 F (36.5 C)     Temp Source 11/13/15 1233 Oral     SpO2 11/13/15 1233 99 %     Weight 11/13/15 1233 150 lb (68.04 kg)     Height 11/13/15 1233 6\' 1"  (1.854 m)     Head Cir --     Peak Flow --      Pain Score 11/13/15 1217 10     Pain Loc --      Pain Edu? --      Excl. in GC? --     Constitutional: Alert and oriented. Well appearing and in no acute distress. Eyes: Conjunctivae are normal. PERRL. EOMI. Head: Atraumatic. Nose: No congestion/rhinnorhea. Mouth/Throat: Mucous membranes are moist. There is very poor distention and especially on tooth 30 with some evidence of. Buccal abscess with swelling but no significant fluctuance, the right mandibular region is also swollen and tender. There is no swelling under the tongue to suggest Ludwig's angina. Speech with a normal voice. There is no evidence of airway issue. Neck: No stridor.   Nontender with no meningismus Cardiovascular: Normal rate, regular rhythm. Grossly normal heart sounds.  Good peripheral circulation. Respiratory: Normal respiratory effort.  No retractions. Lungs CTAB.  Skin:  Skin is warm, dry and intact. No rash noted. There are marks on the skin consistent with track marks Psychiatric: Mood and affect are normal. Speech and behavior are normal.  ____________________________________________   LABS (all labs ordered are listed, but only abnormal results are displayed)  Labs Reviewed  CBC - Abnormal; Notable for the following:    WBC 11.6 (*)    All other components within normal limits  BASIC METABOLIC PANEL - Abnormal; Notable for the following:    Glucose, Bld 157 (*)    All other components within normal limits   ____________________________________________  EKG  I  personally interpreted any EKGs ordered by me or triage  ____________________________________________  RADIOLOGY  I reviewed any imaging ordered by me or triage that were performed during my shift ____________________________________________   PROCEDURES  Procedure(s) performed: None  Critical Care performed: None  ____________________________________________   INITIAL IMPRESSION / ASSESSMENT AND PLAN / ED  COURSE  Pertinent labs & imaging results that were available during my care of the patient were reviewed by me and considered in my medical decision making (see chart for details).  Patient with very poor dentition and periapical swelling could be a phlegmon/cellulitis could be a abscess. We'll obtain a CT scan. No evidence of Ludwig angina no evidence of airway issue. I did offer the patient a Marcaine injection into the area to directly reduce his pain, he initially agreed but when the needle got close he jumped and yelled and said "no way man". I will obtain a CT scan to rule out drainable abscess although I suspect the patient will not let me do it, we will give the patient IV clindamycin in medication and reassess. He is well-appearing and nontoxic.  ____________________________________________   FINAL CLINICAL IMPRESSION(S) / ED DIAGNOSES  Final diagnoses:  Facial swelling     Jeanmarie Plant, MD 11/13/15 1623

## 2016-12-25 ENCOUNTER — Encounter: Payer: Self-pay | Admitting: *Deleted

## 2016-12-25 ENCOUNTER — Emergency Department
Admission: EM | Admit: 2016-12-25 | Discharge: 2016-12-25 | Disposition: A | Discharge status: Home / Self Care | Payer: Self-pay | Attending: Emergency Medicine | Admitting: Emergency Medicine

## 2016-12-25 DIAGNOSIS — Z79899 Other long term (current) drug therapy: Secondary | ICD-10-CM | POA: Insufficient documentation

## 2016-12-25 DIAGNOSIS — F17200 Nicotine dependence, unspecified, uncomplicated: Secondary | ICD-10-CM | POA: Insufficient documentation

## 2016-12-25 DIAGNOSIS — F1910 Other psychoactive substance abuse, uncomplicated: Secondary | ICD-10-CM | POA: Insufficient documentation

## 2016-12-25 DIAGNOSIS — F1994 Other psychoactive substance use, unspecified with psychoactive substance-induced mood disorder: Secondary | ICD-10-CM

## 2016-12-25 DIAGNOSIS — F1410 Cocaine abuse, uncomplicated: Secondary | ICD-10-CM

## 2016-12-25 DIAGNOSIS — F1510 Other stimulant abuse, uncomplicated: Secondary | ICD-10-CM

## 2016-12-25 LAB — CBC WITH DIFFERENTIAL/PLATELET
BASOS PCT: 1 %
Basophils Absolute: 0.1 10*3/uL (ref 0–0.1)
EOS ABS: 0.4 10*3/uL (ref 0–0.7)
EOS PCT: 5 %
HCT: 42.4 % (ref 40.0–52.0)
Hemoglobin: 15.1 g/dL (ref 13.0–18.0)
LYMPHS ABS: 2.6 10*3/uL (ref 1.0–3.6)
Lymphocytes Relative: 34 %
MCH: 32.1 pg (ref 26.0–34.0)
MCHC: 35.7 g/dL (ref 32.0–36.0)
MCV: 89.9 fL (ref 80.0–100.0)
MONO ABS: 1 10*3/uL (ref 0.2–1.0)
MONOS PCT: 14 %
NEUTROS PCT: 46 %
Neutro Abs: 3.6 10*3/uL (ref 1.4–6.5)
PLATELETS: 287 10*3/uL (ref 150–440)
RBC: 4.71 MIL/uL (ref 4.40–5.90)
RDW: 13.2 % (ref 11.5–14.5)
WBC: 7.7 10*3/uL (ref 3.8–10.6)

## 2016-12-25 LAB — COMPREHENSIVE METABOLIC PANEL
ALK PHOS: 70 U/L (ref 38–126)
ALT: 20 U/L (ref 17–63)
AST: 30 U/L (ref 15–41)
Albumin: 4.4 g/dL (ref 3.5–5.0)
Anion gap: 9 (ref 5–15)
BUN: 15 mg/dL (ref 6–20)
CALCIUM: 8.5 mg/dL — AB (ref 8.9–10.3)
CO2: 24 mmol/L (ref 22–32)
CREATININE: 0.94 mg/dL (ref 0.61–1.24)
Chloride: 102 mmol/L (ref 101–111)
GFR calc non Af Amer: >60 mL/min (ref >60)
GLUCOSE: 99 mg/dL (ref 65–99)
Potassium: 3.6 mmol/L (ref 3.5–5.1)
Sodium: 135 mmol/L (ref 135–145)
Total Bilirubin: 0.7 mg/dL (ref 0.3–1.2)
Total Protein: 7.1 g/dL (ref 6.5–8.1)

## 2016-12-25 LAB — URINE DRUG SCREEN, QUALITATIVE (ARMC ONLY)
Amphetamines, Ur Screen: POSITIVE — AB
BENZODIAZEPINE, UR SCRN: POSITIVE — AB
Barbiturates, Ur Screen: NOT DETECTED
CANNABINOID 50 NG, UR ~~LOC~~: POSITIVE — AB
Cocaine Metabolite,Ur ~~LOC~~: POSITIVE — AB
MDMA (Ecstasy)Ur Screen: NOT DETECTED
Methadone Scn, Ur: NOT DETECTED
Opiate, Ur Screen: NOT DETECTED
PHENCYCLIDINE (PCP) UR S: NOT DETECTED
Tricyclic, Ur Screen: NOT DETECTED

## 2016-12-25 LAB — SALICYLATE LEVEL

## 2016-12-25 LAB — ACETAMINOPHEN LEVEL

## 2016-12-25 MED ORDER — NICOTINE 14 MG/24HR TD PT24
14.0000 mg | MEDICATED_PATCH | Freq: Once | TRANSDERMAL | Status: DC
  Administered 2016-12-25: 14 mg via TRANSDERMAL
  Filled 2016-12-25: qty 1

## 2016-12-25 NOTE — Consult Note (Signed)
[name] Consult   Reason for Consult:  31 year old man who is brought here under commitment filed by his mother alleging dangerous behavior Referring Physician:  Marcelene Butte Patient Identification: Steven Stark MRN:  [id number] Principal Diagnosis: Substance induced mood disorder ([place]) Diagnosis:   Patient Active Problem List   Diagnosis Date Noted  . Substance induced mood disorder ([place]) [F19.94] 12/25/2016  . Cocaine abuse [F14.10] 12/25/2016  . Amphetamine abuse [F15.10] 12/25/2016    Total Time spent with patient: 1 hour  Subjective:   Steven Stark is a 31 y.o. male patient admitted with "my mother just does this kind of thing".  HPI:  Patient interviewed. Chart reviewed. Labs reviewed. Patient known from previous encounters. 31 year old man with a well known history of drug abuse brought in by police on commitment. Mother filed commitment paperwork alleging that the patient was making statements about how he was going to go and shoot people last night. The patient says that he had gone over to his mother's house because he and his roommate were having an argument and he just wanted a quiet place to sleep. He denies that he made any statements of a threatening or hostile nature at all. Also denies that he said anything suicidal. When confronted with the information in the petition he scoffs at it and says that he isn't sure that it is not true. Patient denies any thoughts of harming anyone or harming himself. He admits that he continues to use amphetamines and cocaine regularly. Doesn't seem to see this as being much of a problem. Doesn't have much interest in engaging in any kind of substance abuse treatment. Terms of depression denies any psychosis. Currently not getting any mental health treatment.  Medical history: He does have a history of hepatitis. He looks a little underweight. Doesn't have any acute medical complaints.  Social history: He claims that he was  going to be starting a new job today. He apparently lives with a roommate. Family does have a history of making efforts to try to get him into the hospital because of his drug abuse.  Substance abuse history: Well-established problem with stimulant abuse both cocaine and amphetamine. Patient does not see it as being a problem. Denies that he's been drinking or using any other drugs recently. He claims that he goes to [facility] and that they prescribe Suboxone for him. I haven't checked up to see whether this is correct or not.  Past Psychiatric History: Patient has a history of drug abuse. Doesn't really engage in substance abuse treatment. He's been here in the emergency room before under almost identical circumstances. I don't think that we've actually admitted him to the hospital before. No history of suicide attempts.  Risk to Self: Suicidal Ideation: No Suicidal Intent: No Is patient at risk for suicide?: No Suicidal Plan?: No Access to Means: No What has been your use of drugs/alcohol within the last 12 months?: Alcohol, Cocaine & Cannabis How many times?: 0 Other Self Harm Risks: Active Addiction Triggers for Past Attempts: None known Intentional Self Injurious Behavior: None Risk to Others: Homicidal Ideation: No Thoughts of Harm to Others: No Current Homicidal Intent: No Current Homicidal Plan: No Access to Homicidal Means: No Identified Victim: Reports of none History of harm to others?: No Assessment of Violence: None Noted Violent Behavior Description: Reports of none Does patient have access to weapons?: No Criminal Charges Pending?: No Does patient have a court date: No Prior Inpatient Therapy: Prior Inpatient Therapy: Yes  Prior Therapy Dates: Unable to reminder Prior Therapy Facilty/Provider(s): [facility] Reason for Treatment: Substance Abuse Prior Outpatient Therapy: Prior Outpatient Therapy: No Prior Therapy Dates: Reports of none Prior Therapy Facilty/Provider(s):  Reports of none Reason for Treatment: Reports of none Does patient have an ACCT team?: No Does patient have Intensive In-House Services?  : No Does patient have [facility] services? : No Does patient have P4CC services?: No  Past Medical History:  Past Medical History:  Diagnosis Date  . GERD (gastroesophageal reflux disease)   . Hepatitis C   . Restless leg syndrome   . Substance abuse    History reviewed. No pertinent surgical history. Family History:  Family History  Problem Relation Age of Onset  . Alcohol abuse Mother   . Heart disease Maternal Uncle    Family Psychiatric  History: Denies any Social History:  History  Alcohol Use  . Yes     History  Drug Use No    Comment: Previous heroin, cocaine, crystal meth use    Social History   Social History  . Marital status: Single    Spouse name: N/A  . Number of children: N/A  . Years of education: N/A   Social History Main Topics  . Smoking status: Current Every Day Smoker  . Smokeless tobacco: None  . Alcohol use Yes  . Drug use: No     Comment: Previous heroin, cocaine, crystal meth use  . Sexual activity: Not Asked   Other Topics Concern  . None   Social History Narrative  . None   Additional Social History:    Allergies:  No Known Allergies  Labs:  Results for orders placed or performed during the hospital encounter of 12/25/16 (from the past 48 hour(s))  Comprehensive metabolic panel     Status: Abnormal   Collection Time: 12/25/16 10:58 AM  Result Value Ref Range   Sodium 135 135 - 145 mmol/L   Potassium 3.6 3.5 - 5.1 mmol/L   Chloride 102 101 - 111 mmol/L   CO2 24 22 - 32 mmol/L   Glucose, Bld 99 65 - 99 mg/dL   BUN 15 6 - 20 mg/dL   Creatinine, Ser 0.94 0.61 - 1.24 mg/dL   Calcium 8.5 (L) 8.9 - 10.3 mg/dL   Total Protein 7.1 6.5 - 8.1 g/dL   Albumin 4.4 3.5 - 5.0 g/dL   AST 30 15 - 41 U/L   ALT 20 17 - 63 U/L   Alkaline Phosphatase 70 38 - 126 U/L   Total Bilirubin 0.7 0.3 - 1.2 mg/dL    GFR calc non Af Amer >60 >60 mL/min   GFR calc Af Amer >60 >60 mL/min    Comment: (NOTE) The eGFR has been calculated using the CKD EPI equation. This calculation has not been validated in all clinical situations. eGFR's persistently <60 mL/min signify possible Chronic Kidney Disease.    Anion gap 9 5 - 15  CBC with Differential/Platelet     Status: None   Collection Time: 12/25/16 10:58 AM  Result Value Ref Range   WBC 7.7 3.8 - 10.6 K/uL   RBC 4.71 4.40 - 5.90 MIL/uL   Hemoglobin 15.1 13.0 - 18.0 g/dL   HCT 42.4 40.0 - 52.0 %   MCV 89.9 80.0 - 100.0 fL   MCH 32.1 26.0 - 34.0 pg   MCHC 35.7 32.0 - 36.0 g/dL   RDW 13.2 11.5 - 14.5 %   Platelets 287 150 - 440 K/uL  Neutrophils Relative % 46 %   Neutro Abs 3.6 1.4 - 6.5 K/uL   Lymphocytes Relative 34 %   Lymphs Abs 2.6 1.0 - 3.6 K/uL   Monocytes Relative 14 %   Monocytes Absolute 1.0 0.2 - 1.0 K/uL   Eosinophils Relative 5 %   Eosinophils Absolute 0.4 0 - 0.7 K/uL   Basophils Relative 1 %   Basophils Absolute 0.1 0 - 0.1 K/uL  Salicylate level     Status: None   Collection Time: 12/25/16 10:58 AM  Result Value Ref Range   Salicylate Lvl <0.3 2.8 - 30.0 mg/dL  Acetaminophen level     Status: Abnormal   Collection Time: 12/25/16 10:58 AM  Result Value Ref Range   Acetaminophen (Tylenol), Serum <10 (L) 10 - 30 ug/mL    Comment:        THERAPEUTIC CONCENTRATIONS VARY SIGNIFICANTLY. A RANGE OF 10-30 ug/mL MAY BE AN EFFECTIVE CONCENTRATION FOR MANY PATIENTS. HOWEVER, SOME ARE BEST TREATED AT CONCENTRATIONS OUTSIDE THIS RANGE. ACETAMINOPHEN CONCENTRATIONS >150 ug/mL AT 4 HOURS AFTER INGESTION AND >50 ug/mL AT 12 HOURS AFTER INGESTION ARE OFTEN ASSOCIATED WITH TOXIC REACTIONS.   Urine Drug Screen, Qualitative ([facility] only)     Status: Abnormal   Collection Time: 12/25/16 10:59 AM  Result Value Ref Range   Tricyclic, Ur Screen NONE DETECTED NONE DETECTED   Amphetamines, Ur Screen POSITIVE (A) NONE DETECTED   MDMA  (Ecstasy)Ur Screen NONE DETECTED NONE DETECTED   Cocaine Metabolite,Ur [place] POSITIVE (A) NONE DETECTED   Opiate, Ur Screen NONE DETECTED NONE DETECTED   Phencyclidine (PCP) Ur S NONE DETECTED NONE DETECTED   Cannabinoid 50 Ng, Ur [place] POSITIVE (A) NONE DETECTED   Barbiturates, Ur Screen NONE DETECTED NONE DETECTED   Benzodiazepine, Ur Scrn POSITIVE (A) NONE DETECTED   Methadone Scn, Ur NONE DETECTED NONE DETECTED    Comment: (NOTE) 500  Tricyclics, urine               Cutoff 1000 ng/mL 200  Amphetamines, urine             Cutoff 1000 ng/mL 300  MDMA (Ecstasy), urine           Cutoff 500 ng/mL 400  Cocaine Metabolite, urine       Cutoff 300 ng/mL 500  Opiate, urine                   Cutoff 300 ng/mL 600  Phencyclidine (PCP), urine      Cutoff 25 ng/mL 700  Cannabinoid, urine              Cutoff 50 ng/mL 800  Barbiturates, urine             Cutoff 200 ng/mL 900  Benzodiazepine, urine           Cutoff 200 ng/mL 1000 Methadone, urine                Cutoff 300 ng/mL 1100 1200 The urine drug screen provides only a preliminary, unconfirmed 1300 analytical test result and should not be used for non-medical 1400 purposes. Clinical consideration and professional judgment should 1500 be applied to any positive drug screen result due to possible 1600 interfering substances. A more specific alternate chemical method 1700 must be used in order to obtain a confirmed analytical result.  1800 Gas chromato graphy / mass spectrometry (GC/MS) is the preferred 1900 confirmatory method.     Current Facility-Administered Medications  Medication Dose Route Frequency Provider  Last Rate Last Dose  . nicotine (NICODERM CQ - dosed in mg/24 hours) patch 14 mg  14 mg Transdermal Once Daymon Larsen, MD   14 mg at 12/25/16 1059   Current Outpatient Prescriptions  Medication Sig Dispense Refill  . oxyCODONE-acetaminophen (ROXICET) 5-325 MG tablet Take 1 tablet by mouth every 6 (six) hours as needed. (Patient not  taking: Reported on 12/25/2016) 12 tablet 0    Musculoskeletal: Strength & Muscle Tone: within normal limits Gait & Station: normal Patient leans: N/A  Psychiatric Specialty Exam: Physical Exam  Nursing note and vitals reviewed. Constitutional: He appears well-developed and well-nourished.  HENT:  Head: Normocephalic and atraumatic.  Eyes: Conjunctivae are normal. Pupils are equal, round, and reactive to light.  Neck: Normal range of motion.  Cardiovascular: Regular rhythm and normal heart sounds.   Respiratory: Effort normal. No respiratory distress.  GI: Soft.  Musculoskeletal: Normal range of motion.  Neurological: He is alert.  Skin: Skin is warm and dry.  Psychiatric: Thought content normal. His affect is blunt. His speech is delayed. He is slowed. He expresses impulsivity. He exhibits abnormal recent memory.    Review of Systems  Constitutional: Negative.   HENT: Negative.   Eyes: Negative.   Respiratory: Negative.   Cardiovascular: Negative.   Gastrointestinal: Negative.   Musculoskeletal: Negative.   Skin: Negative.   Neurological: Negative.   Psychiatric/Behavioral: Negative.     Blood pressure 113/69, pulse 85, temperature 98.3 F (36.8 C), temperature source Oral, resp. rate 16, height 5' 8"  (1.727 m), weight 68 kg (150 lb), SpO2 98 %.Body mass index is 22.81 kg/m.  General Appearance: Disheveled  Eye Contact:  Minimal  Speech:  Slow  Volume:  Decreased  Mood:  Euthymic  Affect:  Congruent  Thought Process:  Goal Directed  Orientation:  Full (Time, Place, and Person)  Thought Content:  Logical  Suicidal Thoughts:  No  Homicidal Thoughts:  No  Memory:  Immediate;   Fair Recent;   Poor Remote;   Poor  Judgement:  Impaired  Insight:  Lacking  Psychomotor Activity:  Decreased  Concentration:  Concentration: Poor  Recall:  Poor  Fund of Knowledge:  Fair  Language:  Fair  Akathisia:  No  Handed:  Right  AIMS (if indicated):     Assets:  Communication  Skills Housing Resilience  ADL's:  Intact  Cognition:  WNL  Sleep:        Treatment Plan Summary: Plan 31 year old man with cocaine and amphetamine abuse. I don't doubt that he was probably agitated and certainly could've made some wild statements when he was intoxicated and talking to his mother earlier. Patient is now sober and calm down. Not showing any evidence of psychosis. Completely denies any suicidal or homicidal ideation. There is no indication of any mental illness other than his chronic substance abuse issue. Patient currently does not meet commitment criteria. Patient was advised to take seriously the problems he is having an strongly consider getting involved with outpatient substance abuse treatment. Case reviewed with emergency room and TTS. Discontinue involuntary commitment and patient can be discharged.  Disposition: Patient does not meet criteria for psychiatric inpatient admission. Supportive therapy provided about ongoing stressors.  Alethia Berthold, MD 12/25/2016 3:19 PM

## 2016-12-25 NOTE — ED Notes (Signed)
Pt resting in bed, blanket over head, resp even and unlabored

## 2016-12-25 NOTE — ED Notes (Signed)
Pt dressed out by Gerilyn PilgrimJacob EDT

## 2016-12-25 NOTE — ED Notes (Signed)
Pt given lunch tray.

## 2016-12-25 NOTE — ED Triage Notes (Addendum)
Pt arrives under IVC custody with [employer], states he has been using cocaine and made the statement "I am going to break into someones house and steal a gun and shoot everyone", [employer] reports pt was aggressive and fighting upon their arrival, upon arrival to ED pt states "I dont need to be here", states he last used cocaine yesterday

## 2016-12-25 NOTE — ED Notes (Signed)
Pt given blanket, given graham crackers and coke, pt threw graham crackers on floor

## 2016-12-25 NOTE — ED Notes (Signed)
Pt resting in bed, pt given lunch tray

## 2016-12-25 NOTE — ED Notes (Signed)
Pt lying in bed. Shouted out that he wants a phone. Pt informed by security that he could use the phone at 1:00. Explained to pt that it is policy and we have phone hours. Pt not happy, but quiet.

## 2016-12-25 NOTE — ED Provider Notes (Signed)
Time Seen: Approximately 1123  I have reviewed the triage notes  Chief Complaint: Addiction Problem and Aggressive Behavior   History of Present Illness: Steven Stark is a 31 y.o. male who was [place] here [place] [employer]. He admits to doing cocaine last night and apparently had a bad interaction with his family and had told a family member he was "" going to break into someone's house and steal her dad and shoot everyone "". Patient denies any of these thoughts or feelings at this time. He denies any suicidal thoughts, homicidal thoughts, hallucinations. The patient admits to polysubstance abuse and also states he has no interest in recovery programs at this time. Does wishing to smoke a cigarette he has no physical complaints at this time.   Past Medical History:  Diagnosis Date  . GERD (gastroesophageal reflux disease)   . Hepatitis C   . Restless leg syndrome   . Substance abuse     There are no active problems to display for this patient.   History reviewed. No pertinent surgical history.  History reviewed. No pertinent surgical history.  Current Outpatient Rx  . Order #: [id number] Class: Print    Allergies:  Patient has no known allergies.  Family History: Family History  Problem Relation Age of Onset  . Alcohol abuse Mother   . Heart disease Maternal Uncle     Social History: Social History  Substance Use Topics  . Smoking status: Current Every Day Smoker  . Smokeless tobacco: Not on file  . Alcohol use Yes     Review of Systems:   10 point review of systems was performed and was otherwise negative:  Constitutional: No fever Eyes: No visual disturbances ENT: No sore throat, ear pain Cardiac: No chest pain Respiratory: No shortness of breath, wheezing, or stridor Abdomen: No abdominal pain, no vomiting, No diarrhea Endocrine: No weight loss, No night sweats Extremities: No peripheral edema,  cyanosis Skin: No rashes, easy bruising Neurologic: No focal weakness, trouble with speech or swollowing Urologic: No dysuria, Hematuria, or urinary frequency   Physical Exam:  ED Triage Vitals  Enc Vitals Group     BP 12/25/16 1102 (!) 95/58     Pulse Rate 12/25/16 1102 78     Resp 12/25/16 1102 20     Temp 12/25/16 1102 98.7 F (37.1 C)     Temp Source 12/25/16 1102 Oral     SpO2 12/25/16 1102 100 %     Weight 12/25/16 1109 150 lb (68 kg)     Height 12/25/16 1109 5\' 8"  (1.727 m)     Head Circumference --      Peak Flow --      Pain Score --      Pain Loc --      Pain Edu? --      Excl. in GC? --     General: Awake , Alert , and Oriented times 3; GCS 15 Head: Normal cephalic , atraumatic Eyes: Pupils equal , round, reactive to light Nose/Throat: No nasal drainage, patent upper airway without erythema or exudate.  Neck: Supple, Full range of motion, No anterior adenopathy or palpable thyroid masses Lungs: Clear to ascultation without wheezes , rhonchi, or rales Heart: Regular rate, regular rhythm without murmurs , gallops , or rubs Abdomen: Soft, non tender without rebound, guarding , or rigidity; bowel sounds positive and symmetric in all 4 quadrants. No organomegaly .  Extremities: 2 plus symmetric pulses. No edema, clubbing or cyanosis Neurologic: normal ambulation, Motor symmetric without deficits, sensory intact Skin: warm, dry, no rashes   Labs:   All laboratory work was reviewed including any pertinent negatives or positives listed below:  Labs Reviewed  COMPREHENSIVE METABOLIC PANEL - Abnormal; Notable for the following:       Result Value   Calcium 8.5 (*)    All other components within normal limits  ACETAMINOPHEN LEVEL - Abnormal; Notable for the following:    Acetaminophen (Tylenol), Serum <10 (*)    All other components within normal limits  URINE DRUG SCREEN, QUALITATIVE ([facility] ONLY) - Abnormal; Notable for the following:    Amphetamines, Ur  Screen POSITIVE (*)    Cocaine Metabolite,Ur [place] POSITIVE (*)    Cannabinoid 50 Ng, Ur [place] POSITIVE (*)    Benzodiazepine, Ur Scrn POSITIVE (*)    All other components within normal limits  CBC WITH DIFFERENTIAL/PLATELET  SALICYLATE LEVEL   ED Course: The patient's stay here was uneventful and the patient was seen by psychiatry and agrees that the patient does not require involuntary commitment at this time. Most of his issues most likely related to his substance abuse.     Assessment:  Polysubstance abuse   Final Clinical Impression:  Final diagnoses:  Substance abuse     Plan:  Outpatient Patient was referred to [facility] for recovery programs Patient was advised to return immediately if condition worsens. Patient was advised to follow up with their primary care physician or other specialized physicians involved in their outpatient care. The patient and/or family member/power of attorney had laboratory results reviewed at the bedside. All questions and concerns were addressed and appropriate discharge instructions were distributed by the nursing staff.             Jennye Moccasin, MD 12/25/16 [phone]

## 2016-12-25 NOTE — Discharge Instructions (Signed)
Please return immediately if condition worsens. Please contact her primary physician or the physician you were given for referral. If you have any specialist physicians involved in her treatment and plan please also contact them. Thank you for using [facility]. ° °

## 2016-12-25 NOTE — BH Assessment (Signed)
Assessment Note  Steven Stark is an 31 y.o. male who presents to the ER via Law Enforcement due to remarks he made on yesterday (12/24/2016), that he wanted to end his life. According to the patient. He was getting "high on cocaine and suboxone and I must have said something stupid, again. Now, I guess my mom, got me up here (ER)." He denies SI/HI and AV/H. He also denies any intentions to harm his self or anyone else.  Upon initial arrival to the ER, he was agitated and irritable. He states, "I woke to four cops standing over me, yelling and cussing. You'll be mad to."  Patient admits to ongoing use of alcohol, cocaine, Suboxone and cannabis. Due to his drug use, his relationship with his family is conflictual and strained. He denies current involvement with the legal system.   Diagnosis: Substance Induced Mood Disorder  Past Medical History:  Past Medical History:  Diagnosis Date  . GERD (gastroesophageal reflux disease)   . Hepatitis C   . Restless leg syndrome   . Substance abuse     History reviewed. No pertinent surgical history.  Family History:  Family History  Problem Relation Age of Onset  . Alcohol abuse Mother   . Heart disease Maternal Uncle     Social History:  reports that he has been smoking.  He does not have any smokeless tobacco history on file. He reports that he drinks alcohol. He reports that he does not use drugs.  Additional Social History:  Alcohol / Drug Use Pain Medications: See PTA Prescriptions: See PTA Over the Counter: See PTA History of alcohol / drug use?: Yes Longest period of sobriety (when/how long): Unable to quantify Negative Consequences of Use: Financial, Legal, Personal relationships, Work / School Withdrawal Symptoms:  (n/a) Substance #1 Name of Substance 1: Cocaine Substance #2 Name of Substance 2: Cannabis Substance #3 Name of Substance 3: Alcohol  CIWA: CIWA-Ar BP: 113/69 Pulse Rate: 85 COWS:    Allergies: No Known  Allergies  Home Medications:  (Not in a hospital admission)  OB/GYN Status:  No LMP for male patient.  General Assessment Data Location of Assessment: [facility] ED TTS Assessment: In system Is this a Tele or Face-to-Face Assessment?: Face-to-Face Is this an Initial Assessment or a Re-assessment for this encounter?: Initial Assessment Marital status: Single Maiden name: n/a Is patient pregnant?: No Pregnancy Status: No Living Arrangements: Other (Comment) (Transit) Can pt return to current living arrangement?: Yes Admission Status: Involuntary Is patient capable of signing voluntary admission?: No (Under IVC) Referral Source: Self/Family/Friend Insurance type: n/a  Medical Screening Exam [facility] Walk-in ONLY) Medical Exam completed: Yes  Crisis Care Plan Living Arrangements: Other (Comment) (Transit) Legal Guardian: Other: (Self) Name of Psychiatrist: Reports of none Name of Therapist: Reports of none  Education Status Is patient currently in school?: No Current Grade: n/a Highest grade of school patient has completed: n/a Name of school: n/a Contact person: n/a  Risk to self with the past 6 months Suicidal Ideation: No Has patient been a risk to self within the past 6 months prior to admission? : No Suicidal Intent: No Has patient had any suicidal intent within the past 6 months prior to admission? : No Is patient at risk for suicide?: No Suicidal Plan?: No Has patient had any suicidal plan within the past 6 months prior to admission? : No Access to Means: No What has been your use of drugs/alcohol within the last 12 months?: Alcohol, Cocaine & Cannabis  Previous Attempts/Gestures: No How many times?: 0 Other Self Harm Risks: Active Addiction Triggers for Past Attempts: None known Intentional Self Injurious Behavior: None Family Suicide History: No Recent stressful life event(s): Other (Comment), Conflict (Comment), Financial Problems, Turmoil (Comment) (Active  Addiction) Persecutory voices/beliefs?: No Depression: No Depression Symptoms: Isolating, Fatigue, Guilt, Feeling angry/irritable Substance abuse history and/or treatment for substance abuse?: Yes Suicide prevention information given to non-admitted patients: Not applicable  Risk to Others within the past 6 months Homicidal Ideation: No Does patient have any lifetime risk of violence toward others beyond the six months prior to admission? : No Thoughts of Harm to Others: No Current Homicidal Intent: No Current Homicidal Plan: No Access to Homicidal Means: No Identified Victim: Reports of none History of harm to others?: No Assessment of Violence: None Noted Violent Behavior Description: Reports of none Does patient have access to weapons?: No Criminal Charges Pending?: No Does patient have a court date: No Is patient on probation?: No  Psychosis Hallucinations: None noted Delusions: None noted  Mental Status Report Appearance/Hygiene: Unremarkable, In scrubs Eye Contact: Fair Motor Activity: Freedom of movement, Unremarkable Speech: Logical/coherent, Unremarkable Level of Consciousness: Alert Mood: Depressed, Anxious, Helpless, Sad Affect: Angry, Appropriate to circumstance, Labile, Irritable Anxiety Level: Minimal Thought Processes: Coherent, Relevant Judgement: Unimpaired Orientation: Person, Place, Time, Situation, Appropriate for developmental age Obsessive Compulsive Thoughts/Behaviors: None  Cognitive Functioning Concentration: Normal Memory: Recent Intact, Remote Intact IQ: Average Insight: Fair Impulse Control: Good Appetite: Good Weight Loss: 0 Weight Gain: 0 Sleep: No Change Total Hours of Sleep: 7 Vegetative Symptoms: None  ADLScreening [facility] Assessment Services) Patient's cognitive ability adequate to safely complete daily activities?: Yes Patient able to express need for assistance with ADLs?: Yes Independently performs ADLs?: Yes (appropriate for  developmental age)  Prior Inpatient Therapy Prior Inpatient Therapy: Yes Prior Therapy Dates: Unable to reminder Prior Therapy Facilty/Provider(s): [facility] Reason for Treatment: Substance Abuse  Prior Outpatient Therapy Prior Outpatient Therapy: No Prior Therapy Dates: Reports of none Prior Therapy Facilty/Provider(s): Reports of none Reason for Treatment: Reports of none Does patient have an ACCT team?: No Does patient have Intensive In-House Services?  : No Does patient have [facility] services? : No Does patient have P4CC services?: No  ADL Screening (condition at time of admission) Patient's cognitive ability adequate to safely complete daily activities?: Yes Is the patient deaf or have difficulty hearing?: No Does the patient have difficulty seeing, even when wearing glasses/contacts?: No Does the patient have difficulty concentrating, remembering, or making decisions?: No Patient able to express need for assistance with ADLs?: Yes Does the patient have difficulty dressing or bathing?: No Independently performs ADLs?: Yes (appropriate for developmental age) Does the patient have difficulty walking or climbing stairs?: No Weakness of Legs: None Weakness of Arms/Hands: None  Home Assistive Devices/Equipment Home Assistive Devices/Equipment: None  Therapy Consults (therapy consults require a physician order) PT Evaluation Needed: No OT Evalulation Needed: No SLP Evaluation Needed: No Abuse/Neglect Assessment (Assessment to be complete while patient is alone) Physical Abuse: Denies Verbal Abuse: Denies Sexual Abuse: Denies Exploitation of patient/patient's resources: Denies Self-Neglect: Denies Values / Beliefs Cultural Requests During Hospitalization: None Spiritual Requests During Hospitalization: None Consults Spiritual Care Consult Needed: No Social Work Consult Needed: No      Additional Information 1:1 In Past 12 Months?: No CIRT Risk: No Elopement Risk:  No Does patient have medical clearance?: Yes  Child/Adolescent Assessment Running Away Risk: Denies (Patient is an adult)  Disposition:  Disposition Initial Assessment  Completed for this Encounter: Yes Disposition of Patient: Other dispositions (ER MD ordered Psych Consult)  On Site Evaluation by:   Reviewed with Physician:    Steven Gilfordalvin J. Harlem Bula MS, LCAS, LPC, NCC, CCSI Therapeutic Triage Specialist 12/25/2016 2:40 PM

## 2017-11-14 ENCOUNTER — Other Ambulatory Visit: Payer: Self-pay

## 2017-11-14 ENCOUNTER — Encounter: Payer: Self-pay | Admitting: Emergency Medicine

## 2017-11-14 ENCOUNTER — Emergency Department: Payer: Self-pay

## 2017-11-14 ENCOUNTER — Emergency Department
Admission: EM | Admit: 2017-11-14 | Discharge: 2017-11-14 | Payer: Self-pay | Attending: Emergency Medicine | Admitting: Emergency Medicine

## 2017-11-14 DIAGNOSIS — Y9389 Activity, other specified: Secondary | ICD-10-CM | POA: Insufficient documentation

## 2017-11-14 DIAGNOSIS — X501XXA Overexertion from prolonged static or awkward postures, initial encounter: Secondary | ICD-10-CM | POA: Insufficient documentation

## 2017-11-14 DIAGNOSIS — Y929 Unspecified place or not applicable: Secondary | ICD-10-CM | POA: Insufficient documentation

## 2017-11-14 DIAGNOSIS — F17210 Nicotine dependence, cigarettes, uncomplicated: Secondary | ICD-10-CM | POA: Insufficient documentation

## 2017-11-14 DIAGNOSIS — Y999 Unspecified external cause status: Secondary | ICD-10-CM | POA: Insufficient documentation

## 2017-11-14 DIAGNOSIS — S82024A Nondisplaced longitudinal fracture of right patella, initial encounter for closed fracture: Secondary | ICD-10-CM | POA: Insufficient documentation

## 2017-11-14 NOTE — ED Triage Notes (Addendum)
Pt reports hurt right knee in "scuffle" with police today. Unsure if twisted.  Came from jail.  Denies SI.  Using drugs.  Only here to be cleared for knee pain. Pt also fell asleep while sitting on bench at jail per his report and fell off

## 2017-11-14 NOTE — ED Notes (Signed)
Discharge instructions given to police who will pass the info on to jail. Pt taken out thru the ambulance bay and placed into the police vehicle

## 2017-11-14 NOTE — ED Provider Notes (Signed)
[facility] Emergency Department Provider Note  ____________________________________________  Time seen: Approximately 7:08 PM  I have reviewed the triage vital signs and the nursing notes.   HISTORY  Chief Complaint Knee Pain    HPI Steven Stark is a 32 y.o. male who presents emergency department in the custody of law enforcement.  Patient was resisting arrest earlier today, and the scuffle he is unsure whether he twisted his knee or landed on it after he was taken to the ground to be arrested.  Patient reports that the knee pain is throbbing at this time.  He did not hit his head or lose consciousness.  Patient denies any hip pain or ankle pain.  No radicular symptoms.  No medications for this complaint prior to arrival.  As mentioned above, patient is in the custody of law enforcement.  Patient was asked if he was suicidal or homicidal at triage.  Patient denied same.  In the room, patient asked "can I be IVC so I do not have to go to jail and can get some help?"  When asked if patient was suicidal he at first states yes, but upon further questioning, he states no.  When I advised patient that IVC status does not change whether patient remains in law enforcement custody, patient admits that he was attempting to seek a medical IVC so he did not have to go to jail.  Past Medical History:  Diagnosis Date  . GERD (gastroesophageal reflux disease)   . Hepatitis C   . Restless leg syndrome   . Substance abuse [facility])     Patient Active Problem List   Diagnosis Date Noted  . Substance induced mood disorder (HCC) 12/25/2016  . Cocaine abuse (HCC) 12/25/2016  . 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 No pertinent surgical history.  Prior to Admission medications   Medication Sig Start Date End Date Taking? Authorizing Provider  oxyCODONE-acetaminophen (ROXICET) 5-325 MG tablet Take 1 tablet by mouth every 6 (six) hours as needed. Patient not  taking: Reported on 12/25/2016 11/13/15   Jeanmarie Plant, MD    Allergies Patient has no known allergies.  Family History  Problem Relation Age of Onset  . Alcohol abuse Mother   . Heart disease Maternal Uncle     Social History Social History   Tobacco Use  . Smoking status: Current Every Day Smoker  . Smokeless tobacco: Never Used  Substance Use Topics  . Alcohol use: Yes  . Drug use: Yes    Types: Cocaine, Marijuana    Comment: meth, heroine     Review of Systems  Constitutional: No fever/chills Eyes: No visual changes.  Cardiovascular: no chest pain. Respiratory: no cough. No SOB. Gastrointestinal: No abdominal pain.  No nausea, no vomiting.   Musculoskeletal: Positive for R knee pain Skin: Negative for rash, abrasions, lacerations, ecchymosis. Neurological: Negative for headaches, focal weakness or numbness. 10-point ROS otherwise negative.  ____________________________________________   PHYSICAL EXAM:  VITAL SIGNS: ED Triage Vitals  Enc Vitals Group     BP 11/14/17 1839 104/72     Pulse Rate 11/14/17 1839 85     Resp 11/14/17 1839 16     Temp 11/14/17 1839 98 F (36.7 C)     Temp Source 11/14/17 1839 Oral     SpO2 11/14/17 1839 96 %     Weight 11/14/17 1841 140 lb (63.5 kg)     Height 11/14/17 1841 6\' 1"  (1.854 m)  Head Circumference --      Peak Flow --      Pain Score 11/14/17 1843 10     Pain Loc --      Pain Edu? --      Excl. in GC? --      Constitutional: Alert and oriented. Well appearing and in no acute distress. Eyes: Conjunctivae are normal. PERRL. EOMI. Head: Atraumatic. Neck: No stridor.    Cardiovascular: Normal rate, regular rhythm. Normal S1 and S2.  Good peripheral circulation. Respiratory: Normal respiratory effort without tachypnea or retractions. Lungs CTAB. Good air entry to the bases with no decreased or absent breath sounds. Musculoskeletal: Full range of motion to all extremities. No gross deformities appreciated.   Mild abrasion noted to the anterior knee, no bleeding.  No foreign body.  No gross edema, ecchymosis noted to the knee.  Full range of motion to the right knee.  Patient is tender to palpation over the patella and medial joint line.  No palpable abnormality.  Varus, valgus, Lachman's, McMurray's is negative.  Palpation along the quadriceps tendon and patellar ligament revealed no deficits.  Radial pulse intact.  Sensation intact x5 digits distally.  Examination of the ankle and hip is unremarkable. Neurologic:  Normal speech and language. No gross focal neurologic deficits are appreciated.  Skin:  Skin is warm, dry and intact. No rash noted. Psychiatric: Mood and affect are agitated. Speech and behavior are slightly pressured. Patient exhibits appropriate insight and judgement.   ____________________________________________   LABS (all labs ordered are listed, but only abnormal results are displayed)  Labs Reviewed - No data to display ____________________________________________  EKG   ____________________________________________  RADIOLOGY Festus BarrenI, Yesica Kemler D Jorgeluis Gurganus, personally viewed and evaluated these images (plain radiographs) as part of my medical decision making, as well as reviewing the written report by the radiologist.  Dg Knee Complete 4 Views Right  Result Date: 11/14/2017 CLINICAL DATA:  Injured right knee today. EXAM: RIGHT KNEE - COMPLETE 4+ VIEW COMPARISON:  None. FINDINGS: The joint spaces are maintained. No degenerative changes. No joint effusion. Linear lucent lines involving the lateral aspect of the patella could be due to bipartite patella but a fracture is also possible. Recommend correlation with exact area of patient's pain and tenderness. I would expect to see a joint effusion if this was a patellar fracture. IMPRESSION: Nondisplaced lateral patellar fracture versus bipartite patella. Recommend correlation with exact area of patient's pain and tenderness. No joint  effusion. Electronically Signed   By: Rudie MeyerP.  Gallerani M.D.   On: 11/14/2017 19:48    ____________________________________________    PROCEDURES  Procedure(s) performed:    .Splint Application Date/Time: 11/14/2017 8:33 PM Performed by: Racheal Patchesuthriell, Regla Fitzgibbon D, PA-C Authorized by: Racheal Patchesuthriell, Herberta Pickron D, PA-C   Consent:    Consent obtained:  Verbal   Consent given by:  Healthcare agent   Risks discussed:  Pain Pre-procedure details:    Sensation:  Normal Procedure details:    Laterality:  Right   Location:  Leg   Splint type:  Long leg and ankle stirrup   Supplies:  Cotton padding, Ortho-Glass and elastic bandage Post-procedure details:    Pain:  Improved   Sensation:  Normal   Patient tolerance of procedure:  Tolerated well, no immediate complications Comments:     Long leg splint is applied.  Due to patient's incarceration, after discussing the case with the RN in the jail, it was determined that fiberglass splinting would be more appropriate for patient and detention officer  safety.      Medications - No data to display   ____________________________________________   INITIAL IMPRESSION / ASSESSMENT AND PLAN / ED COURSE  Pertinent labs & imaging results that were available during my care of the patient were reviewed by me and considered in my medical decision making (see chart for details).  Review of the [place] CSRS was performed in accordance of the [facility] prior to dispensing any controlled drugs.     Patient's diagnosis is consistent with patellar fracture.  Patient with possible patellar fracture on x-ray.  On imaging, I determine whether this might be a bipartite patella versus fracture.  Patient is tender to palpation in this area with trauma to the region.  As such, patient will be treated as if he does have a true patellar fracture.  There is no indication for urgent surgical intervention.  As such, the knee is immobilized and patient is released into custody of  law enforcement to return to jail.  I discussed the patient with the RN at the jail.  Tylenol and/or Motrin for pain while incarcerated.  Patient is to follow-up with orthopedics for further evaluation and management.  Patient is given ED precautions to return to the ED for any worsening or new symptoms.     ____________________________________________  FINAL CLINICAL IMPRESSION(S) / ED DIAGNOSES  Final diagnoses:  Closed nondisplaced longitudinal fracture of right patella, initial encounter      NEW MEDICATIONS STARTED DURING THIS VISIT:  ED Discharge Orders    None          This chart was dictated using voice recognition software/Dragon. Despite best efforts to proofread, errors can occur which can change the meaning. Any change was purely unintentional.    Lanette Hampshire 11/14/17 2034    Sharman Cheek, MD 11/14/17 [phone]

## 2017-11-14 NOTE — Discharge Instructions (Signed)
Patient with possible patellar fracture versus bipartite patella.  Due to mechanism of injury, with pain complaint in this region, assume fracture.  As such, patient needs to remain immobilized until cleared by orthopedics.  Patient is to be nonweightbearing on affected extremity until cleared by orthopedics.  Patient may have Tylenol and/or Motrin every 4-6 hours as needed for pain.  Follow-up with orthopedics for further management and clearance.

## 2018-08-11 ENCOUNTER — Encounter: Payer: Self-pay | Admitting: Emergency Medicine

## 2018-08-11 ENCOUNTER — Other Ambulatory Visit: Payer: Self-pay

## 2018-08-11 ENCOUNTER — Emergency Department
Admission: EM | Admit: 2018-08-11 | Discharge: 2018-08-11 | Disposition: A | Discharge status: Home / Self Care | Payer: Self-pay | Attending: Emergency Medicine | Admitting: Emergency Medicine

## 2018-08-11 DIAGNOSIS — F17200 Nicotine dependence, unspecified, uncomplicated: Secondary | ICD-10-CM | POA: Insufficient documentation

## 2018-08-11 DIAGNOSIS — K029 Dental caries, unspecified: Secondary | ICD-10-CM | POA: Insufficient documentation

## 2018-08-11 DIAGNOSIS — K047 Periapical abscess without sinus: Secondary | ICD-10-CM | POA: Insufficient documentation

## 2018-08-11 MED ORDER — IBUPROFEN 800 MG PO TABS: 800.0000 mg | ORAL_TABLET | Freq: Three times a day (TID) | ORAL | 0 refills | Status: DC | PRN

## 2018-08-11 MED ORDER — LIDOCAINE VISCOUS HCL 2 % MT SOLN
15.0000 mL | Freq: Once | OROMUCOSAL | Status: AC
  Administered 2018-08-11: 15 mL via OROMUCOSAL
  Filled 2018-08-11: qty 15

## 2018-08-11 MED ORDER — AMOXICILLIN 500 MG PO CAPS: 500.0000 mg | ORAL_CAPSULE | Freq: Three times a day (TID) | ORAL | 0 refills | Status: DC

## 2018-08-11 MED ORDER — OXYCODONE-ACETAMINOPHEN 7.5-325 MG PO TABS: 1.0000 | ORAL_TABLET | Freq: Four times a day (QID) | ORAL | 0 refills | Status: DC | PRN

## 2018-08-11 NOTE — ED Triage Notes (Signed)
Right upper jaw toothache and swelling.  Toothache x 1 day, swelling since this am.

## 2018-08-11 NOTE — ED Provider Notes (Signed)
[facility] Emergency Department Provider Note   ____________________________________________   First MD Initiated Contact with Patient 08/11/18 1230     (approximate)  I have reviewed the triage vital signs and the nursing notes.   HISTORY  Chief Complaint Dental Pain    HPI Steven Stark is a 32 y.o. male patient presents with right upper jaw pain secondary to dental abscess.  Patient states toothache started yesterday.  Patient awakened this morning with increased facial edema.  Patient has a history of poor dental hygiene and has not followed up with a dentist.  Patient rates his pain as a 10/10.  Patient described the pain is "sharp/ache".  No palliative measures for complaint. Past Medical History:  Diagnosis Date  . GERD (gastroesophageal reflux disease)   . Hepatitis C   . Restless leg syndrome   . Substance abuse [facility])     Patient Active Problem List   Diagnosis Date Noted  . Substance induced mood disorder (HCC) 12/25/2016  . Cocaine abuse (HCC) 12/25/2016  . 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 No pertinent surgical history.  Prior to Admission medications   Medication Sig Start Date End Date Taking? Authorizing Provider  amoxicillin (AMOXIL) 500 MG capsule Take 1 capsule (500 mg total) by mouth 3 (three) times daily. 08/11/18   Joni Reining, PA-C  ibuprofen (ADVIL,MOTRIN) 800 MG tablet Take 1 tablet (800 mg total) by mouth every 8 (eight) hours as needed for moderate pain. 08/11/18   Joni Reining, PA-C  oxyCODONE-acetaminophen (PERCOCET) 7.5-325 MG tablet Take 1 tablet by mouth every 6 (six) hours as needed for severe pain. 08/11/18   Joni Reining, PA-C  oxyCODONE-acetaminophen (ROXICET) 5-325 MG tablet Take 1 tablet by mouth every 6 (six) hours as needed. Patient not taking: Reported on 12/25/2016 11/13/15   Jeanmarie Plant, MD    Allergies Patient has no known allergies.  Family History  Problem  Relation Age of Onset  . Alcohol abuse Mother   . Heart disease Maternal Uncle     Social History Social History   Tobacco Use  . Smoking status: Current Every Day Smoker  . Smokeless tobacco: Never Used  Substance Use Topics  . Alcohol use: Yes  . Drug use: Yes    Types: Cocaine, Marijuana    Comment: meth, heroine    Review of Systems Constitutional: No fever/chills Eyes: No visual changes. ENT: No sore throat.  Dental pain Cardiovascular: Denies chest pain. Respiratory: Denies shortness of breath. Gastrointestinal: No abdominal pain.  No nausea, no vomiting.  No diarrhea.  No constipation. Genitourinary: Negative for dysuria. Musculoskeletal: Negative for back pain. Skin: Negative for rash. Neurological: Negative for headaches, focal weakness or numbness. Psychiatric:Substance abuse Endocrine:Hepatitis C   ____________________________________________   PHYSICAL EXAM:  VITAL SIGNS: ED Triage Vitals [08/11/18 1228]  Enc Vitals Group     BP      Pulse      Resp      Temp      Temp src      SpO2      Weight 139 lb 15.9 oz (63.5 kg)     Height      Head Circumference      Peak Flow      Pain Score 10     Pain Loc      Pain Edu?      Excl. in GC?    Constitutional: Alert and oriented. Well appearing and in  no acute distress. Eyes: Conjunctivae are normal. PERRL. EOMI. Head: Atraumatic. Nose: No congestion/rhinnorhea. Mouth/Throat: Mucous membranes are moist.  Oropharynx non-erythematous.  Multiple caries right upper molar area.  Gingiva edema and erythema.  Uvula is midline. Neck: No stridor. Hematological/Lymphatic/Immunilogical: No cervical lymphadenopathy. Cardiovascular: Normal rate, regular rhythm. Grossly normal heart sounds.  Good peripheral circulation. Respiratory: Normal respiratory effort.  No retractions. Lungs CTAB. Skin:  Skin is warm, dry and intact. No rash noted. __________________________________________   LABS (all labs ordered are  listed, but only abnormal results are displayed)  Labs Reviewed - No data to display ____________________________________________  EKG   ____________________________________________  RADIOLOGY  ED MD interpretation:    Official radiology report(s): No results found.  ____________________________________________   PROCEDURES  Procedure(s) performed: None  Procedures  Critical Care performed: No  ____________________________________________   INITIAL IMPRESSION / ASSESSMENT AND PLAN / ED COURSE  As part of my medical decision making, I reviewed the following data within the electronic MEDICAL RECORD NUMBER    Dental pain secondary to abscess.  Patient elected not to have IV antibiotics or pain medication.  Patient given discharge care instructions and a list of dental clinics for follow-up care.  Patient advised take medication as directed.      ____________________________________________   FINAL CLINICAL IMPRESSION(S) / ED DIAGNOSES  Final diagnoses:  Dental abscess     ED Discharge Orders         Ordered    amoxicillin (AMOXIL) 500 MG capsule  3 times daily     08/11/18 1310    oxyCODONE-acetaminophen (PERCOCET) 7.5-325 MG tablet  Every 6 hours PRN     08/11/18 1310    ibuprofen (ADVIL,MOTRIN) 800 MG tablet  Every 8 hours PRN     08/11/18 1310           Note:  This document was prepared using Dragon voice recognition software and may include unintentional dictation errors.    Joni Reining, PA-C 08/11/18 1321    Charlynne Pander, MD 08/12/18 [phone]

## 2018-08-11 NOTE — Discharge Instructions (Signed)
Follow-up from list of dental clinics provided. °OPTIONS FOR DENTAL FOLLOW UP CARE ° °[place] Department of Health and Human Services - Local Safety Net Dental Clinics °http://www.ncdhhs.gov/dph/oralhealth/services/safetynetclinics.htm °  °[facility] (336-562-3123) ° °[facility] (919-933-9087) ° °[facility] (919-663-1744 ext 237) ° °[facility] (336-570-6415) ° °[facility] (919-968-2025) °This clinic caters to the indigent population and is on a lottery system. °Location: °[facility], [address], [place] °Clinic Hours: °Wednesdays from 6pm - 9pm, patients seen by a lottery system. °For dates, call or go to [email or web address].[facility].edu/[facility]/patients/Dental-[facility] °Services: °Cleanings, fillings and simple extractions. °Payment Options: °DENTAL WORK IS FREE OF CHARGE. Bring proof of income or support. °Best way to get seen: °Arrive at 5:15 pm - this is a lottery, NOT first come/first serve, so arriving earlier will not increase your chances of being seen. °  °  °[facility] °919-537-3737 °Select option 1 for emergencies °  °Location: °[facility], [address], [place] °Clinic Hours: °No walk-ins accepted - call the day before to schedule an appointment. °Check in times are 9:30 am and 1:30 pm. °Services: °Simple extractions, temporary fillings, pulpectomy/pulp debridement, uncomplicated abscess drainage. °Payment Options: °PAYMENT IS DUE AT THE TIME OF SERVICE.  Fee is usually $100-200, additional surgical procedures (e.g. abscess drainage) may be extra. °Cash, checks, Visa/MasterCard accepted.  Can file Medicaid if patient is covered for dental - patient should call case worker to check. °No discount for [facility] patients. °Best way to get seen: °MUST call the day before and get onto the schedule. Can usually be seen the next 1-2 days. No walk-ins accepted. °  °   °[facility] °919-933-9087 °  °Location: °[facility], [address], [place] °Clinic Hours: °M, W, Th, F 8am or 1:30pm, Tues 9a or 1:30 - first come/first served. °Services: °Simple extractions, temporary fillings, uncomplicated abscess drainage.  You do not need to be an [place] resident. °Payment Options: °PAYMENT IS DUE AT THE TIME OF SERVICE. °Dental insurance, otherwise sliding scale - bring proof of income or support. °Depending on income and treatment needed, cost is usually $50-200. °Best way to get seen: °Arrive early as it is first come/first served. °  °  °[facility] °919-542-1641 °  °Location: °[address] °Clinic Hours: °Mon-Thu 8a-5p °Services: °Most basic dental services including extractions and fillings. °Payment Options: °PAYMENT IS DUE AT THE TIME OF SERVICE. °Sliding scale, up to 50% off - bring proof if income or support. °Medicaid with dental option accepted. °Best way to get seen: °Call to schedule an appointment, can usually be seen within 2 weeks OR they will try to see walk-ins - show up at 8a or 2p (you may have to wait). °  °  °[facility] °919-245-2435 °[place] RESIDENTS ONLY °  °Location: °[facility], [address], [place], [place] [address] °Clinic Hours: By appointment only. °Monday - Thursday 8am-5pm, Friday 8am-12pm °Services: Cleanings, fillings, extractions. °Payment Options: °PAYMENT IS DUE AT THE TIME OF SERVICE. °Cash, Visa or MasterCard. Sliding scale - $30 minimum per service. °Best way to get seen: °Come in to office, complete packet and make an appointment - need proof of income °or support monies for each household member and proof of [place] residence. °Usually takes about a month to get in. °  °  °[facility] °  [phone] °  °Location: °[address] [place] °Clinic Hours: Walk-in Urgent Care  Dental Services are offered Monday-Friday mornings only. °The numbers of emergencies accepted daily is limited to the number of °providers available. °Maximum 15 - Mondays, Wednesdays & Thursdays °Maximum 10 - Tuesdays & Fridays °Services: °You do not need to be a [place] resident to be seen for a dental emergency. °Emergencies are defined as pain, swelling, abnormal bleeding, or dental trauma. Walkins will receive x-rays if needed. °NOTE: Dental cleaning is not an emergency. °Payment Options: °PAYMENT IS DUE AT THE TIME OF SERVICE. °Minimum co-pay is $40.00 for uninsured patients. °Minimum co-pay is $3.00 for Medicaid with dental coverage. °Dental Insurance is accepted and must be presented at time of visit. °Medicare does not cover dental. °Forms of payment: Cash, credit card, checks. °Best way to get seen: °If not previously registered with the clinic, walk-in dental registration begins at 7:15 am and is on a first come/first serve basis. °If previously registered with the clinic, call to make an appointment. °  °  °[facility] °919-776-4359 °[place] RESIDENTS ONLY °  °Location: °[address], [place], [place] °Clinic Hours: °Mon-Thu 10a-2p °Services: Extractions only! °Payment Options: °FREE (donations accepted) - bring proof of income or support °Best way to get seen: °Call and schedule an appointment OR come at 8am on the 1st Monday of every month (except for holidays) when it is first come/first served. °  °  °[facility] °[phone] °  °Location: °[address], [place] °Clinic Hours: °Friday mornings °Services, Payment Options, Best way to get seen: °Call for info ° °

## 2018-08-11 NOTE — ED Notes (Signed)
See triage note  States he developed dental pain yesterday pain to upper right   Woke up woke with swelling to right side of face

## 2019-07-23 ENCOUNTER — Encounter (HOSPITAL_COMMUNITY): Payer: Self-pay | Admitting: *Deleted

## 2019-07-23 ENCOUNTER — Other Ambulatory Visit: Payer: Self-pay

## 2019-07-23 ENCOUNTER — Emergency Department (HOSPITAL_COMMUNITY)
Admission: EM | Admit: 2019-07-23 | Discharge: 2019-07-23 | Disposition: A | Discharge status: Home / Self Care | Payer: Self-pay | Attending: Emergency Medicine | Admitting: Emergency Medicine

## 2019-07-23 DIAGNOSIS — Z5321 Procedure and treatment not carried out due to patient leaving prior to being seen by health care provider: Secondary | ICD-10-CM | POA: Insufficient documentation

## 2019-07-23 DIAGNOSIS — F1110 Opioid abuse, uncomplicated: Secondary | ICD-10-CM | POA: Insufficient documentation

## 2019-07-23 NOTE — Discharge Instructions (Addendum)
Substance Abuse Treatment Programs ° °Intensive Outpatient Programs °[facility]     °[address]      °[place], [place]                   °[phone]      ° °[facility] °[address] °[place], [place] °[phone] ° °[facility]     °(Inpatient and outpatient)     °[address]           °[phone]   ° °[facility] °[phone] (Suboxone and Methadone) ° °[address]      °[place], [place] [address]      °[phone]      ° °[address] °[place], [place] °[phone] ° °[facility] (Outpatient/Inpatient, [facility])    °(insurance only) [phone]      °       °[facility]Groups & Residential) °[place], [place] °[phone] ° °   °[facility]     °[address]     °[place], [place]      °[phone]      ° °[facility] (for caregivers and family) °[address] °[place], [place] °336-299-4655 ° ° ° ° ° °Residential Treatment Programs °[facility]      °[address], [place], [place] [address]  °([phone]      ° °[facility] °[address] [place], [place] [address] °[phone] ° °[facility]        °[phone]      ° °[facility] °1-800-659-3381 ° °[facility]             °[address]                                         °[place], [place]                                                °877-615-2722 or 336-784-9470                              ° °[facility] [place] °[address] °[place], [address] °1.877.941.8954 ° °[facility]    °[address]      °, [place]     °[phone]      ° °[facility] °[address] °, [place] °[phone] ° °[facility]   °[address]     °[place], [place] [address]     °[phone]      °Admissions: 8am-3pm M-F ° °[facility] °[address] °[place],  [place] °336-227-7417 ° °[facility]: Residential Program (90 Days)   °[place], [place]      °336-725-8389 or 800-758-6077    ° °[facility] °[place], [place] °(Walk in Hours over the weekend or by referral) ° °[facility] °[address], [place], [place] [address] °(336) [phone] ° °Crisis Mobile: [facility]:  1-877-626-1772 (for crisis response 24 hours a day) °[facility] Hotline:      1-800-256-2452 °Outpatient Psychiatry and Counseling ° °[facility]: Mobile Crisis   Management 24 hours:  1-579-867-5796  [facility] of the [employer] sliding scale fee and walk in schedule: M-F 8am-12pm/1pm-3pm [address]  [place], [place] [address] [place], [place] [address] [phone]  [facility] ([facility])- new patient walk-in appointments available Monday - Friday 8am -3pm.          [address] [place], [place] [address] [phone] or crisis line- [facility]/ Intensive Outpatient Therapy Program [place], [place] [address] [place]      [phone] [place] [place], [place] [address]                 [place]   [facility] [phone] [place], [place] [address]   [facility]          [address] Johnette Abraham  [place], [place] [address]       [phone]  [facility] [address] [place], [place] [address] [phone]  [facility]    [address] [place], [place] [address]     [place], [place] Joycelyn Man     [place] [address]     [phone]       [facility] [place] [address]  [facility]     [address], [place], [place], [place] [address] [place]     [address]     [place], [place] [address]     [phone]       [facility] [address] [place], [place] [address] [phone] Resources for Temporary Residential Assistance/Crisis [facility]) M-F 8am-3pm   [address] [place], [place] [address]   [phone] Services include: laundry, barbering, support groups, case management, phone  & computer access, showers, AA/NA mtgs, mental health/substance abuse nurse, job skills class, disability information, VA assistance, spiritual classes, etc.   [facility]   [address], [place]     [place]              [employer] (women and children)       [place]. [place], [place] [address] [phone] [email or web address] for application and process Application Required  [facility]   [address]    [place] [address]     [phone]                    [place],  [address] [phone](OILNZVJK application appt.) Application Required  [facility] (women only)    [address]     [place],  [address]     [phone]  Intake starts 6pm daily Need valid ID, SSC, & Police report [facility] [address] [place], [place] [phone] Application Required  [employer] (men only)     [place].      [place], [place]       [facility] (Pregnant women only) [address], [place]  [facility]      [place] [address]      [place], [place] [address]     [phone]             [facility] [address] [place], [place] 90 day commitment/SA/Application process  [facility](men only)     [address]     [place], [place]       Check-in at [facility] [address] [place],  [address] [phone] Men/Women/Women and Children must be there by 7 pm  [place], [place]                   Call the resources given to you today if you are interested in detox programs.  Call your regular medical doctor on Monday to schedule a follow up appointment within the next week.  Return to the Emergency Department immediately sooner if worsening.

## 2019-07-23 NOTE — ED Notes (Signed)
Pt given water and a sandwich.

## 2019-07-23 NOTE — ED Triage Notes (Signed)
The patient was found in a parking lot post a heroin over dose. EMS found him unconscious with agonal breathing. He was breathing about 4 times per minute. EMS bagged him and gave him 1mg  of Narcan. He has a history of drug abuse.     EMS Vitals:  124/80 BP 100 HR 124 CBG 18 RR

## 2019-07-23 NOTE — ED Provider Notes (Signed)
[facility]-EMERGENCY DEPT Provider Note   CSN: [id number] Arrival date & time: 07/23/19  1454     History   Chief Complaint No chief complaint on file.   HPI Steven Stark is a [age] y.o. male.     HPI  Pt was seen at 1535. Per EMS and pt report: Pt states he just got out of jail 2 days ago, was hanging out with friends today and decided to use heroin. Pt endorses hx of IV heroin use. Pt states "I guess it just really hit me." Pt was found in a parking lot, unresponsive, agonal resps. EMS gave narcan with improvement of pt back to his baseline. Pt denies SI/SA, stating "I just made a mistake," "I'm a drug user." Denies HI, AVH, SI/SA, no CP/SOB, no abd pain, no N/V/D.   Past Medical History:  Diagnosis Date  . GERD (gastroesophageal reflux disease)   . Hepatitis C   . Restless leg syndrome   . Substance abuse [facility](HCC)     Patient Active Problem List   Diagnosis Date Noted  . Substance induced mood disorder (HCC) 12/25/2016  . Cocaine abuse (HCC) 12/25/2016  . 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 No pertinent surgical history.      Home Medications    Prior to Admission medications   Medication Sig Start Date End Date Taking? Authorizing Provider  amoxicillin (AMOXIL) 500 MG capsule Take 1 capsule (500 mg total) by mouth 3 (three) times daily. Patient not taking: Reported on 07/23/2019 08/11/18   Joni ReiningSmith, Ronald K, PA-C  ibuprofen (ADVIL,MOTRIN) 800 MG tablet Take 1 tablet (800 mg total) by mouth every 8 (eight) hours as needed for moderate pain. Patient not taking: Reported on 07/23/2019 08/11/18   Joni ReiningSmith, Ronald K, PA-C  oxyCODONE-acetaminophen (PERCOCET) 7.5-325 MG tablet Take 1 tablet by mouth every 6 (six) hours as needed for severe pain. Patient not taking: Reported on 07/23/2019 08/11/18   Joni ReiningSmith, Ronald K, PA-C  oxyCODONE-acetaminophen (ROXICET) 5-325 MG tablet Take 1 tablet by mouth every 6 (six) hours as needed. Patient not taking:  Reported on 12/25/2016 11/13/15   Jeanmarie PlantMcShane, James A, MD    Family History Family History  Problem Relation Age of Onset  . Alcohol abuse Mother   . Heart disease Maternal Uncle     Social History Social History   Tobacco Use  . Smoking status: Current Every Day Smoker  . Smokeless tobacco: Never Used  Substance Use Topics  . Alcohol use: Yes  . Drug use: Yes    Types: Cocaine, Marijuana    Comment: meth, heroine     Allergies   Patient has no known allergies.   Review of Systems Review of Systems ROS: Statement: All systems negative except as marked or noted in the HPI; Constitutional: Negative for fever and chills. ; ; Eyes: Negative for eye pain, redness and discharge. ; ; ENMT: Negative for ear pain, hoarseness, nasal congestion, sinus pressure and sore throat. ; ; Cardiovascular: Negative for chest pain, palpitations, diaphoresis, dyspnea and peripheral edema. ; ; Respiratory: Negative for cough, wheezing and stridor. ; ; Gastrointestinal: Negative for nausea, vomiting, diarrhea, abdominal pain, blood in stool, hematemesis, jaundice and rectal bleeding. . ; ; Genitourinary: Negative for dysuria, flank pain and hematuria. ; ; Musculoskeletal: Negative for back pain and neck pain. Negative for swelling and trauma.; ; Skin: Negative for pruritus, rash, abrasions, blisters, bruising and skin lesion.; ; Neuro: Negative for headache, lightheadedness and neck stiffness. Negative  for weakness, altered level of consciousness, altered mental status, extremity weakness, paresthesias, involuntary movement, seizure and syncope.;; Psych:  No SI, no SA, no HI, no hallucinations.        Physical Exam Updated Vital Signs BP (!) 134/99 (BP Location: Right Arm)   Pulse (!) 117   Temp 97.9 F (36.6 C) (Oral)   Resp 18   SpO2 96%   Physical Exam 1540: Physical examination:  Nursing notes reviewed; Vital signs and O2 SAT reviewed;  Constitutional: Well developed, Well nourished, Well  hydrated, In no acute distress; Head:  Normocephalic, atraumatic; Eyes: EOMI, PERRL, No scleral icterus; ENMT: Mouth and pharynx normal, Mucous membranes moist; Neck: Supple, Full range of motion, No lymphadenopathy; Cardiovascular: Regular rate and rhythm, No gallop; Respiratory: Breath sounds clear & equal bilaterally, No wheezes.  Speaking full sentences with ease, Normal respiratory effort/excursion; Chest: Nontender, Movement normal; Abdomen: Soft, Nontender, Nondistended, Normal bowel sounds; Genitourinary: No CVA tenderness; Spine:  No midline CS, TS, LS tenderness.;; Extremities: Peripheral pulses normal, No tenderness, No edema, No calf edema or asymmetry.; Neuro: AA&Ox3, Major CN grossly intact.  Speech clear. No gross focal motor or sensory deficits in extremities.; Skin: Color normal, Warm, Dry.   ED Treatments / Results  Labs (all labs ordered are listed, but only abnormal results are displayed)   EKG None  Radiology   Procedures Procedures (including critical care time)  Medications Ordered in ED Medications - No data to display   Initial Impression / Assessment and Plan / ED Course  I have reviewed the triage vital signs and the nursing notes.  Pertinent labs & imaging results that were available during my care of the patient were reviewed by me and considered in my medical decision making (see chart for details).     MDM Reviewed: previous chart, nursing note and vitals    1610:  Pt states he "Just made a mistake" after getting out of jail. States he's "a drug user" and "just wants to leave now." Pt remains A&O s/p narcan given 1.5+hrs ago. Admits to polysubstance abuse hx, no SI/SA today.  Has tol PO well without N/V. States he feels like his usual baseline and "needs to catch my ride;" while calling friends/family. Will d/c with friends. Stable.     Final Clinical Impressions(s) / ED Diagnoses   Final diagnoses:  Heroin abuse [facility])    ED Discharge Orders     None       Francine Graven, DO 07/27/19 1522

## 2022-09-29 ENCOUNTER — Other Ambulatory Visit: Payer: Self-pay

## 2022-09-29 MED ORDER — OLANZAPINE 5 MG PO TABS
5.0000 mg | ORAL_TABLET | Freq: Every day | ORAL | 1 refills | Status: AC
  Filled 2022-09-29: qty 30, 30d supply, fill #0

## 2022-10-08 ENCOUNTER — Other Ambulatory Visit: Payer: Self-pay

## 2023-06-04 ENCOUNTER — Emergency Department
Admission: EM | Admit: 2023-06-04 | Discharge: 2023-06-04 | Discharge status: Against Medical Advice | Payer: MEDICAID | Attending: Emergency Medicine | Admitting: Emergency Medicine

## 2023-06-04 DIAGNOSIS — Z5321 Procedure and treatment not carried out due to patient leaving prior to being seen by health care provider: Secondary | ICD-10-CM | POA: Insufficient documentation

## 2023-06-04 DIAGNOSIS — F1593 Other stimulant use, unspecified with withdrawal: Secondary | ICD-10-CM | POA: Insufficient documentation

## 2023-06-04 DIAGNOSIS — F1113 Opioid abuse with withdrawal: Secondary | ICD-10-CM | POA: Diagnosis not present

## 2023-06-04 NOTE — ED Triage Notes (Addendum)
Pt presents to the ED POV from home. Pt states that he is looking for a place to detox. States that he has been trying to find a place all day and that no one has beds. Pt states "my probation officer is putting up a stink about it". Pt states that he is trying to detox from meth and fentanyl. States that he is currently taking methadone. Pt states that he got a DUI a couple of days ago. States that he used fentanyl and meth this morning. States that he did these intravenously. Pt states that he is hoping a doctor will steer him in the right direction. Pt denies SI or HI. States that he is hoping by going through [place] he can get a bed somewhere. Pt states that lots of places won't take him due to him taking methadone, but states that he is hoping to find a place that will let him stay on methadone while coming off meth and fentanyl. Pt states that if his probation officer calls up here it is ok to let them know he is here. Pt pleasant and cooperative. Pt states that he knows he's a mess and just wants some help.

## 2023-06-22 ENCOUNTER — Ambulatory Visit (HOSPITAL_COMMUNITY)
Admission: EM | Admit: 2023-06-22 | Discharge: 2023-06-22 | Disposition: A | Discharge status: Home / Self Care | Payer: MEDICAID | Attending: Registered Nurse | Admitting: Registered Nurse

## 2023-06-22 ENCOUNTER — Encounter (HOSPITAL_COMMUNITY): Payer: Self-pay | Admitting: Registered Nurse

## 2023-06-22 DIAGNOSIS — F1914 Other psychoactive substance abuse with psychoactive substance-induced mood disorder: Secondary | ICD-10-CM | POA: Diagnosis not present

## 2023-06-22 DIAGNOSIS — F1994 Other psychoactive substance use, unspecified with psychoactive substance-induced mood disorder: Secondary | ICD-10-CM | POA: Diagnosis present

## 2023-06-22 DIAGNOSIS — F1910 Other psychoactive substance abuse, uncomplicated: Secondary | ICD-10-CM | POA: Diagnosis present

## 2023-06-22 NOTE — Discharge Instructions (Signed)
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 you. Ask them how SLA helped them. They will explain SLA and tell you what city they have a bed available. It' okay if it's not the city you want, go to that city. You can always transfer to another city when a bed is available. Don't wait - GO Mom/Dad - Our Best Program is the [employer]. It cost $1,890 a month. We know dealing with a loved one who is addicted has cost you thousands. [facility]  Program is Intensive Recovery for 30-days. This includes 4 classes per day, plus group and individual sessions with a state licensed counselor. Give them the best chance at a new life. If you don't have support - it's okay. START-UP Program will give you the same intensive recovery for your first 4 days. After that, we'll connect you with one of our staffing partners After work, you will be going to 12-step meetings daily meetings, plus living around of bunch of other clean and sober people. We'll help you find a 12-step sponsor and you'll get this thing. Don't wait - Call Tammy NOW [phone] (Start Living Again)  How We help Housing - If you are like [place], we need a healthy place to live. We need to be around people who are clean & sober. SLA connects you with roommates in recovery in a self-supporting and democratically run apartment. This is Peer to Peer Recovery. We call it the therapeutic value of one addict helping. Recovery Education Every wonder why you drink or use so much? Why you can't quit? Our Education Program will teach you the answers. We also teach life skills, spiritual, and career education. (See Education Page) [employer] If you are like most of [place], we came into SLA with very little. No Money, No Job, No Recovery, No transportation. SLA knows this. Our new fleet of vans help people get to work, [employer], [employer], and the store. No, we are not going to drive you anywhere you want to go - but most locations are on public transportation. Career Development Honestly to me this is more of a Jobs Program, but people said Publishing rights manager sounded better. We have wonderful relationships with local staffing companies, who care about you. They understand what you are going through and are willing to give you another chance. (Some say a second chance, but if you're like me, you used up the second chance years ago). For some these jobs turn into a career, for others,  it's a wonderful step in building the life you've always wanted. WHY [facility] Benefits of [facility]'s Addiction Program. Services Offered [facility]  Housing Apartment Style Living with Roommates  Peer to Peer Recovery   Same Day Admission   No Funds Needed for Admission   Long-Term Garment/textile technologist   Freedom to Work   Higher education careers adviser   AA/NA Meetings - Onsite   AA/NA Meetings - Offsite   Licensed Counseling ([facility] Program)  Real World Experiences   Life Skills Classes   Relapse Prevention   Spiritual Development   AA Big Book Meetings   AA 12 Step Studies   Monthly Cost $800 mo. Landscape architect - [facility]acle Program)   [facility] Program State Licensed Counselor - 1 individual session and 2 group sessions per week 30-days focused on Recovery. No outside jobs yet. Recovery/Life Skills/Spiritual Education Outside Activities  For those with family support, our BEST product is the Pinnacle cost is $1,890 per month Pinnacle includes all the features below, Housing, Education, and [employer], plus you receive individual and group counseling with a Production designer, theatre/television/film. What is [employer]? Recovery Education THERE IS HOPE In [place], a bunch of drug addicts/alcoholics discovered a way out. They wrote a book called Alcoholics Anonymous. In the forward to the first edition they said, "to show other alcoholics (addicts) precisely how we have recovered is the main purpose of this book". In other words, they wrote a "How to Stay Clean and Sober for Dummies" book. SLA and the wonderful 12 step sponsors will help teach you the clear-cut instructions on how to live a happy and successful life. (News Flash - If you are in a 12-step program, then you should work the 12 steps). The Body So why do some people stop drinking/using while others can't? In the 1930's, Dr. Hollace Kinnier, Medical Director of [facility] in [place]. Said  addiction is a manifestation of an allergy, and the phenomenon of craving is limited to this class of person and never occurs in the average drinker/user (see AA Big Book xxviii). To put this in 2nd grade terms (sorry that's the only way I can understand things), some people are allergic to peanut butter, some are gluten intolerant, some have shell-fish allergies (remember Will Katrinka Blazing in the [employer]?). When he consumed shellfish, his body had a reaction, his face swelled up. Likewise, when some of [place] start drinking or taking pain pills, we have an allergic reaction, and that reaction is that we want another one (more more more). Duh, right? (News Flash - that does not happen to normal people. Surgeon General says that 1 in 7 people will suffer.) Because people have never taught this wonderful discovery by Dr. Cora Daniels, our friends and loved ones tell us that if we cared, we would stop. And Lord, we agree. So, we try our use of willpower, but to no avail. Then we feel worse, we feel like failures. What happened to our willpower? Do you think a person allergic to peanut butter can use their willpower to cure themselves? Of course not! The Mind So why can't we quit? Local treatment professionals will correctly explain that your neurotransmitters - dopamine, serotonin, and endorphins are like the happy chemicals in your brain. That these chemicals dictate our actions. Dr. Cora Daniels explained it like this. When we stop using, we get Restless, Irritable, and Discontented. When this happens, your mind looks for ease and comfort. It tells [place] that a drink or pill would help. But when we use, our body have that abnormal / allergic reaction, and the phenomenon of craving, the more more more happens. So, here we go on a drinking/drugging spree again, waking up feeling horrible. We swear to our loved ones we will never do it again. (We call that the 12-step national anthem). The Dr. says this is repeated over and over  again, and unless this person can experience and entire physic change, there is very little hope of their recovery. Summary We can't drink/use because our body has an allergic reaction - an abnormal reaction called a phenomenon of craving (I want more more more), I can't stop because my mind won't let me. When I try to quit, my mind gets Restless, Irritable, and Discontented. I'm screwed. 12 step people call this Powerless. Step one says, we admitted we were powerless/screwed over our addiction, and that  our lives had become unmanageable.  Career Development Honestly to me this is more of a Jobs Program, but people said Publishing rights manager sounded better. We have wonderful relationships with local staffing companies, who care about you. They understand what you are going through and are willing to give you another chance. (Some say a second chance, but if you're like me, you used up the second chance years ago). For some these jobs turn into a career, for others, it's a wonderful step in building the life you've always wanted. Our Jobs Program allows SLA to help the addict who is broke without government handouts. It's a Pay it Forward idea. When you enter SLA with no money, it was actually your roommates who made it possible. Then when you get on your feet, you do the same for the next newcomer.   Spiritual Education When an alcoholic/addict stops using, most are left with a spiritual void that needs to be filled. Alcohol and drugs had become our Child psychotherapist. Now that we are not using, what are we going to do? Although [facility] is a Comptroller and we are grateful to God, we certainly did not enter recovery that way. For most of [place], it was the opposite. Why would God allow these BAD things to happen to [place]? Our parents and kids didn't deserve this! At [facility] we will help you find those answers for yourself and more. We partner with Life Lessons Over Lunch a program of [employer] and [employer]. Mardelle Matte does not shove Jesus down your throat. Instead, he provides [place] with lessons and allows [place] to decide what to believe. We love all our friends and Multimedia programmer at [employer] - Thanks [place], [place], & [place]. Our spiritual education continues when groups like the students from [facility] come to visit. They bring pizza, snacks, and allow our residents to openly share their faith or the lack thereof. We also have wonderful small groups and local pastors who visit. They don't care if we are Jewish, Muslim, Hindu, or even atheist. They love [place] and take time for [place] anyway - It's a beautiful thing. We love all of you guys too - THANKS There is a vast amount of positive spiritual material out there. To name a few, Delrae Rend, Enedina Finner, Ainsley Spinner, [address], Zig Ziglar, Og Mandino, Gateway, and many more.  Transportation Services If you are like most of [place], we came into SLA with very little. No Money, No Job, No Recovery, No transportation. SLA knows this. Our new fleet of vans help people get to work, [employer], [employer], and the store. No, we are not going to drive you anywhere you want to go - but most locations are on public transportation. We hope this gives you a better understanding of what life is like at SLA. To summarize A home Recovery A job A Presenter, broadcasting ya'll this is starting to get long. We should write a book about this one day. (Or study the Alcoholics Anonymous Book and find some old-timer to explain it) Life Skills involved things like making your bed, keeping your apartment neat and clean, getting along with roommates, showing up to work and recovery meetings on time. Having honor; doing what you say you're going to do, and showing up when we say we are going to show up. (I sure didn't do that when I was using). Life skills along with Recovery Education continues with feeding our  minds with positive thoughts instead of  negative ones. Learning how to show love, patience, and tolerance for those around [place]. People are going to let us down, but then again, we let people down too - yes? Try love and patience instead of getting angry, even though they don't deserve it. You'll be much happier when you do. [facility] [address], [place], [place] [address] Phone: [phone] Taking the First Step For many people, taking the essential first step of seeking treatment for substance abuse and co-occurring mental health disorders is often the most difficult. As a result, we, at [facility], have designed our admissions process to be as smooth and painless as possible. As a primary substance abuse treatment center, we treat people who are struggling with virtually all forms of substance abuse and addiction. If a person is interested in our services, the admissions process begins with a phone call and a brief, confidential conversation with one of the compassionate and knowledgeable members of our admissions team. Our skilled admissions team is available to discuss treatment and schedule admissions 24 hours per day, seven days per week, even on holidays. Patients, family members, loved ones, and/or referral sources will be asked a series of questions so we can get to know the potential patient. These questions are designed to help [place] get a picture of what is happening with the potential patient so we can determine the best course of treatment and assess whether we would be a good fit. These questions include a history of alcohol or drug use, medical background, treatment history, and insurance information. Our Admissions Process Soon after this initial call, we process insurance information and contact the potential patient to discuss financial issues. Once this step is complete, we can schedule a date for admission. Because of our streamlined process, we can often schedule an admission date the same day  as the initial call. Our Admissions Criteria [facility] is open to men and women ages 38 and older who have a primary substance use disorder diagnosis. We also accept men and women with both a primary substance use disorder and co-occurring mental health diagnoses. We are unable to provide care for individuals who are homicidal or those who have a history of sex offenses. All of our rooms are handicapped accessible. If you or your loved one is struggling with a substance use disorder, do not hesitate to give [place] a call. Today could be first day of your journey toward the substance-free life that you deserve.HOW IT WORKS - [facility]  [facility] provides food, shelter, clothing, and recovery services to those seeking help for addiction to drugs and/or alcohol. Detox and our long-term residential recovery program are offered at no cost to the client. Outpatient Services accepts [facility]. There are also private pay options available. We want there to be as few barriers to recovery as possible.  DETOX The primary purpose of detox is to detoxify and stabilize an individual from drugs and/or alcohol. The secondary purpose of detox is to prepare the client to enter into one of our programs and to a life in recovery. This is when the clients begin to identify a common problem and a common solution.  LONG-TERM RESIDENTIAL RECOVERY PROGRAM MOTIVATIONAL TRACK - SOBER180SM Clients accepted into [facility]'s long-term recovery program begin the program in the [facility] portion of the program. During this 14-day period, clients feel more protected by remaining on property except for court dates  and medical appointments. While in [facility], clients attend approximately 80 classes and 12-Step meetings while they become acclimated to [facility] environment. Safe Haven clients are housed with those who have already begun the motivational phase of our program and begin  bonding with others in the program. In the motivational phase, called Off the Streets (OTS), clients work with peers in similar circumstances to motivate one another to adopt social skills and learn core principles central to Alcoholics Anonymous and Narcotics Anonymous. While in OTS, our clients come to understand the concept of the physical allergy. Day classes are held off campus at [employer], located at [address] and [place]. Santina Evans (men) and [address] and [place] (women). These classes, which use the SOBER180 curriculum, are where clients begin accepting their self-centered disease problem and its spiritual solution. Our clients also learn the basics of responsibility and move away from a "street" mentality. Along the way, they make a commitment to the solution.  RECOVERY STAGE - SOBER180SM The recovery stage, called Phase I, is where clients learn how to apply the 12 Steps of Alcoholics Anonymous and Narcotics Anonymous in their lives with the [id number] curriculum. This curriculum consists of classes and written assignments. All clients are assisted throughout this process by Peer Mentors, who are men and women who recently completed [facility] recovery program. The first part of Phase I stresses strong personal accountability - being on time for classes and meetings, completing job assignments, etc. - and encourages clients to look at their own behavior. This is facilitated at the [facility]. The second part of Phase I focuses on interpersonal skills, stressing concern and accountability for others in the program. This is achieved through role modeling, holding peers accountable for their actions, and giving support to others.  OUTPATIENT SERVICES We understand that not everyone has time for, or can afford, an inpatient treatment program due to job commitments, school, and family. We also understand that not everyone qualifies for inpatient treatment. Our Outpatient Services staff will work  with clients to develop a treatment plan that is best for them and their schedule. Our services are designed to facilitate time management, increase accountability, and develop life skills.  INTENSIVE OUTPATIENT GROUPS Our intensive outpatient groups are three hours long and take place four days every week. Each group is made up of 8-15 people and focuses on practical ways clients can apply recovery principles in their life. Our IOP lasts approximately six to eight weeks and during this time, we help clients develop a strong recovery support network. We offer flexible group times in order to fit their schedule.  PEER SUPPORT Our team of Peer Support Specialists meet with clients regularly throughout the week in individual and group settings to help them navigate the recovery process by role modeling healthy behaviors.  CASE MANAGEMENT Substance abuse often creates problems that make recovery more difficult. Case management addresses those issues so clients can focus on their recovery. Our team helps with stable employment, medical care, housing, legal assistance, and other issues.  INDIVIDUAL COUNSELING We also offer individual counseling. A counselor is available for clients to talk to about any thoughts or feelings related to their recovery that they would prefer to discuss outside a group setting.  SOBER HOUSING Safe and stable housing is a crucial element to early sobriety. We provide sober housing to all clients engaged in our intensive outpatient program at no cost. We also provide housing, at a very affordable rate, to anyone who needs it  once formal clinical treatment is completed. Meals are also provided at no cost.  TRANSITIONAL CARE Once a client completes the program, he or she can choose to become a Audiological scientist. Peer Research scientist (medical) to serve as Multimedia programmer and role models for those who are newer in the program. Peer Mentors teach classes, monitor assignments, coordinate job assignments,  and work one-on-one with individuals moving through the recovery process. Peer Mentors demonstrate [facility] philosophy that the best solution is one alcoholic or addict reaching back to help another on the journey to recovery. Clients who complete our program and choose not to become a Peer Mentor are able to continue to live on property at a small cost while finding work, allowing them to transition slowly back into life. All clients who complete our program receive support and assistance from our continuing care staff, including help with finding employment, housing, and navigating legal and medical issues.  GET HELP NOW [facility] [address] [place], [place] [address] [phone] [facility] [address] [place], [place] [address] [phone] [facility] [address] [place], [place] [address] [phone] Outpatient Services [address] [place], [place] [address] [phone]   Substance Abuse Treatment Programs  Intensive Outpatient Programs [facility]     [address]      [place], [place]                   [phone]       [facility] [address] [place] [address] [place], [place] [phone]  [facility]     (Inpatient and outpatient)     [address]           [phone]    [facility] [phone] (Suboxone and Methadone)  [address]      [place], [place] [address]      [phone]       [address] [place], [place] [phone]  [facility] (Outpatient/Inpatient, Chemical)    (insurance only) [phone]             [facility] (Groups & Residential) [place], [place] [phone]     [facility]     [address]     [place], [place]      [phone]       [facility] (for caregivers and family) [phone] Laurell Josephs. [address] [place], [place] [phone]      Residential Treatment  Programs [facility]      [address] [place], [place] [address]  [phone]       [facility] [address] [place], [place] [address] [phone]  [facility] [place]        [phone]       [facility] [phone]  [facility] ([facility]             [address]                                         [place], [place]                                                [phone] or [phone]  [facility] [address] [place], [address] [phone]  [facility]    [address]      [place], [place]     [phone]       [facility] [address] [place], [place] [phone]  [facility]   [address] [place], [place] [address]     [phone]      Admissions: 8am-3pm M-F  [facility] Services ([facility]) [address] [place], [place] [phone]  [facility] Program: Residential Program [phone] Days)   [place], [place]      [phone] or [phone]     ADATC: [facility] [place], [place] (Walk in Hours over the weekend or by referral)  [facility] [address] [place], [place], [place] [address] [phone]  Crisis Mobile: [facility]:  [phone] (for crisis response 24 hours a day) [facility] Hotline:      [phone] Outpatient Psychiatry and Counseling  [facility]: Mobile Crisis Management 24 hours:  [phone]  [facility] of the [employer] sliding scale fee and walk in schedule: M-F 8am-12pm/1pm-3pm [address]  [place], [place] [address] [phone]  [facility] [address] [place], [place] [address] [phone]  [facility] new patient walk-in appointments available Monday - Friday 8am -3pm.          [address] [place], [place]  [address] [phone] or crisis line- [phone]  [facility] Services/ Intensive Outpatient Therapy Program [address] [place], [place] [address] [phone]  [facility]                  Crisis Services      [phone] [address]     [place], [place] [address]                 [facility]   [facility] [phone]. [address] [place], [place] [address]   [facility]          [address] [place]  [place], [place] [id number]       [phone]  [facility] [address] [place], [place] [address] [phone]  [facility]    [address]    [place], [place] [address]     [phone]       Andee Poles, MD     [address]     [place] [address]     [phone]       [facility] [address] [place] [address]  [facility]     [address] [place], [place]      [phone]       [facility] Eulogio Ditch, MD [address] [place], [place] [address] [phone]  [facility]     [address]     [place], [place] [address]     [phone]       [facility] [address] [place], [place] [address] [phone] Resources for Temporary Residential Assistance/Crisis Centers  [facility]) M-F 8am-3pm   [address] [place], [place] [address]   [phone] Services include: laundry, barbering, support groups, case management, phone  &  computer access, showers, AA/NA mtgs, mental health/substance abuse nurse, job skills class, disability information, VA assistance, spiritual classes, etc.   HOMELESS SHELTERS  [facility]     [facility]   [address] [place]      [phone]              [employer] (women and children)       [address] [place], [place] [address] [phone] [email or web address] for application and process Application Required  [facility]   [address]    [place] [address]     [phone]                    [facility] [address] [place], [place] [address] [phone] application appt.) Application Required  [facility] (women only)    [address]     [place], [place] [address]     [phone]      Intake starts 6pm daily Need valid ID, SSC, & Police report [employer] [address] [place], [place] [phone] Application Required  [employer] (men only)     [address]      [place], [place]     [phone]       [facility] (Pregnant women only) [address] [place], [place] [phone]  [facility]      [address]      [place], [place] [address]     [phone]             [facility] [address] [place], [place] [phone] 90 day commitment/SA/Application process  [facility](men only)     [address]     [place], [place]     [phone]       Check-in at [facility] [address] [place], [place] [address] [phone] Men/Women/Women and Children must be there by 7 pm  [facility] [place], [place] [phone]

## 2023-06-22 NOTE — ED Provider Notes (Signed)
Behavioral Health Urgent Care Medical Screening Exam  Patient Name: Steven Stark MRN: [id number] Date of Evaluation: 06/22/23 Chief Complaint:   Detox services Diagnosis:  Final diagnoses:  Polysubstance abuse (HCC)  Substance induced mood disorder (HCC)    History of Present illness: Steven Stark is a 37 y.o. male patient presented to [facility] as a walk in voluntarily requesting detox and rehab services.     Steven Stark, 37 y.o., male patient seen face to face by this provider, chart reviewed, and consulted with Dr. Gretta Cool on 06/22/23.  On evaluation Steven Stark reports he is wanting to detox from meth and fentanyl.  He states that he hears voices encouraging him and then states it is his own thoughts.  States he is not actually hearing voices.  Patient denies suicidal/self-harm/homicidal ideation, psychosis, and paranoia.  State he is wanting to start Suboxone while in detox.  Reports recently stopped going to a Methadone Clinic about 2 weeks ago related to his continuous of drug use.  Now wanting to detox and restart treatment.  He reports his last use was today in early morning of both meth and fentanyl.  Patient informed that Suboxone was not used during detox at this facility.  Patient then states that he did not want admission to the [facility] and would prefer to go somewhere that used Suboxone. Informed by this provider a list of resources could be given but he would have to call.  Informed that there was outpatient substance use provider that did prescribe Suboxone.  Understanding voiced and states he would rather look for another place.  Patient is currently living with his mother and unemployed.       During evaluation Steven Stark is seated in exam room with no noted distress.  He is alert/oriented x 4, calm, cooperative, attentive, and responses were relevant and appropriate to assessment questions.  He spoke in a clear tone at moderate volume, and  normal pace, with good eye contact.   He denies suicidal/self-harm/homicidal ideation, psychosis, and paranoia.  Objectively there is no evidence of psychosis/mania or delusional thinking.  He conversed coherently, with goal directed thoughts, and no distractibility, or pre-occupation.  At this time Steven Stark is educated and verbalizes understanding of mental health resources and other crisis services in the community. He is instructed to call 911 and present to the nearest emergency room should he experience any suicidal/homicidal ideation, auditory/visual/hallucinations, or detrimental worsening of his mental health condition.  He was a also advised by Clinical research associate that he could call the toll-free phone on back of  insurance card to assist with identifying counselors and agencies in network or [place] card to speak with care coordinator.   Resources were given   Flowsheet Row ED from 06/22/2023 in [facility] ED from 06/04/2023 in [facility] Department at [facility]  C-SSRS RISK CATEGORY No Risk No Risk       Psychiatric Specialty Exam  Presentation  General Appearance:Appropriate for Environment  Eye Contact:Good  Speech:Clear and Coherent; Normal Rate  Speech Volume:Normal  Handedness:Right   Mood and Affect  Mood: Anxious  Affect: Appropriate; Congruent   Thought Process  Thought Processes: Coherent; Goal Directed  Descriptions of Associations:Intact  Orientation:Full (Time, Place and Person)  Thought Content:Logical  Diagnosis of Schizophrenia or Schizoaffective disorder in past: No   Hallucinations:None  Ideas of Reference:None  Suicidal Thoughts:No  Homicidal Thoughts:No   Sensorium  Memory: Immediate  Good; Recent Good; Remote Good  Judgment: Intact  Insight: Present   Executive Functions  Concentration: Good  Attention Span: Good  Recall: Good  Fund of  Knowledge: Good  Language: Good   Psychomotor Activity  Psychomotor Activity: Normal   Assets  Assets: Communication Skills; Desire for Improvement; Housing   Sleep  Sleep: Good  Number of hours: No data recorded  Physical Exam: Physical Exam Vitals and nursing note reviewed.  Constitutional:      General: He is not in acute distress.    Appearance: Normal appearance. He is not ill-appearing.  HENT:     Head: Normocephalic.  Eyes:     Conjunctiva/sclera: Conjunctivae normal.  Cardiovascular:     Rate and Rhythm: Normal rate.  Pulmonary:     Effort: Pulmonary effort is normal. No respiratory distress.  Musculoskeletal:        General: Normal range of motion.     Cervical back: Normal range of motion.  Skin:    General: Skin is warm and dry.  Neurological:     Mental Status: He is alert and oriented to person, place, and time.  Psychiatric:        Attention and Perception: Attention and perception normal. He does not perceive auditory hallucinations.        Mood and Affect: Affect normal. Mood is anxious.        Speech: Speech normal.        Behavior: Behavior normal.        Thought Content: Thought content normal. Thought content is not paranoid or delusional. Thought content does not include homicidal or suicidal ideation.        Cognition and Memory: Cognition normal.        Judgment: Judgment normal.    Review of Systems  Constitutional:        No other complaints voiced  Psychiatric/Behavioral:  Positive for substance abuse (Polysubstance). Depression: Stable. Hallucinations: Denies. Suicidal ideas: Denies.The patient does not have insomnia. Nervous/anxious: Stable.  All other systems reviewed and are negative.  Blood pressure 120/69, pulse 81, temperature 98.3 F (36.8 C), temperature source Oral, resp. rate 20, SpO2 98%. There is no height or weight on file to calculate BMI.  Musculoskeletal: Strength & Muscle Tone: within normal limits Gait &  Station: normal Patient leans: N/A   [facility] MSE Discharge Disposition for Follow up and Recommendations: Based on my evaluation the patient does not appear to have an emergency medical condition and can be discharged with resources and follow up care in outpatient services for Medication Management, Substance Abuse Intensive Outpatient Program, and Individual Therapy   Loraine Bhullar, NP 06/22/2023, 12:45 PM

## 2023-06-22 NOTE — Progress Notes (Signed)
   06/22/23 0920  [facility] Triage Screening (Walk-ins at [facility] only)  How Did You Hear About [place]? Self  What Is the Reason for Your Visit/Call Today? Pt presents to [facility] voluntarily unaccompanied seeking detox and substance use treatment. Pt reports using meth and fentanyl daily, last use was lastnight .25 of meth and 1/2 gram of fentanyl. Pt reports hearing auditory hallucinations earlier today, hearing a voice telling him encouraging words, but denies currently. Pt denies any withdrawal symptoms.Pt denies SI/HI and AVH currently.  How Long Has This Been Causing You Problems? <Week  Have You Recently Had Any Thoughts About Hurting Yourself? No  Are You Planning to Commit Suicide/Harm Yourself At This time? No  Have you Recently Had Thoughts About Hurting Someone Karolee Ohs? No  Are You Planning To Harm Someone At This Time? No  Are you currently experiencing any auditory, visual or other hallucinations? No  Have You Used Any Alcohol or Drugs in the Past 24 Hours? Yes  How long ago did you use Drugs or Alcohol? lastnight  What Did You Use and How Much? lastnight .25 of meth and 1/2 gram of fentanyl.  Do you have any current medical co-morbidities that require immediate attention? No  Clinician description of patient physical appearance/behavior: rolling eyes, involuntarily movements, slow and drawn out speech, he denies using any substances today despite the appearance of drug use.  What Do You Feel Would Help You the Most Today? Alcohol or Drug Use Treatment  If access to [facility] was not available, would you have sought care in the Emergency Department? No  Determination of Need Urgent (48 hours)  Options For Referral Facility-Based Crisis

## 2023-06-22 NOTE — BH Assessment (Addendum)
Comprehensive Clinical Assessment (CCA) Note  06/22/2023 Steven Stark [id number]  DISPOSITION: Per Assunta Found NP, pt is psychiatrically cleared. Per NP, pt is looking for programs that offer Suboxone. Per NP pt will be given community resources that match his request.   The patient demonstrates the following risk factors for suicide: Chronic risk factors for suicide include: psychiatric disorder of MDD and GAD and substance use disorder. Acute risk factors for suicide include: unemployment and social withdrawal/isolation. Protective factors for this patient include: positive social support and hope for the future. Considering these factors, the overall suicide risk at this point appears to be low. Patient is appropriate for outpatient follow up.   Per Triage assessment: "Pt presents to [facility] voluntarily unaccompanied seeking detox and substance use treatment. Pt reports using meth and fentanyl daily, last use was lastnight .25 of meth and 1/2 gram of fentanyl. Pt reports hearing auditory hallucinations earlier today, hearing a voice telling him encouraging words, but denies currently. Pt denies any withdrawal symptoms.Pt denies SI/HI and AVH currently."  With further assessment: Pt stated that he currently lives with his mother and was raised by his mother and step-father. Pt stated he has never been married and has no children. Pt stated he is unemployed and stopped school in the 10th grade. Pt stated he did completed a GED. Pt stated that he currently uses methamphetamine and fentanyl daily. Pt denied SI, HI, NSSH, and paranoia. Pt stated that he began hearing "something like a TV is on in the next room" when he started using daily. He stated he has AH "all day everyday."   Pt stated that he feels shame and guilt about his relapse in December 2023. Pt stated that he was able to stay "drug-free" for about 8 months until then. Pt stated he has been through substance use treatment before but  relapsed after he went to prison in 2020 and was released in April 2022. Pt stated that he was able to use while in prison.   Pt stated he recently stopped going to a Methadone Clinic about 2 weeks ago. Pt stated that he uses both "an upper" and "a downer." Pt stated he has trouble sleeping when using the stimulant. Pt stated that he has lost "a lot of weight" since using methamphetamine regularly.   Pt reported a current DUI with court date of 06/29/23. Pt also stated he is on probation in [employer]. Pt stated he was in prison for 2 years and released 02/12/21.     Chief Complaint:  Chief Complaint  Patient presents with   Addiction Problem   Visit Diagnosis:  Stimulant use d/o, Amphetamine Opioid Use d/o MDD, Recurrent, Moderate   CCA Screening, Triage and Referral (STR)  Patient Reported Information How did you hear about [place]? Self  What Is the Reason for Your Visit/Call Today? Pt presents to [facility] voluntarily unaccompanied seeking detox and substance use treatment. Pt reports using meth and fentanyl daily, last use was lastnight .25 of meth and 1/2 gram of fentanyl. Pt reports hearing auditory hallucinations earlier today, hearing a voice telling him encouraging words, but denies currently. Pt denies any withdrawal symptoms.Pt denies SI/HI and AVH currently.  How Long Has This Been Causing You Problems? <Week  What Do You Feel Would Help You the Most Today? Alcohol or Drug Use Treatment   Have You Recently Had Any Thoughts About Hurting Yourself? No  Are You Planning to Commit Suicide/Harm Yourself At This time? No   Flowsheet Row  ED from 06/22/2023 in [facility] ED from 06/04/2023 in [facility] at [facility]  C-SSRS RISK CATEGORY No Risk No Risk       Have you Recently Had Thoughts About Hurting Someone Karolee Ohs? No  Are You Planning to Harm Someone at This Time? No  Explanation: na  Have You Used Any Alcohol  or Drugs in the Past 24 Hours? Yes  What Did You Use and How Much? lastnight .25 of meth and 1/2 gram of fentanyl.   Do You Currently Have a Therapist/Psychiatrist? No (not for 8 months)  Name of Therapist/Psychiatrist: Name of Therapist/Psychiatrist: na   Have You Been Recently Discharged From Any Office Practice or Programs? Yes (Pt stated he recently stopped going to a Methadone Clinic about 2 weeks ago.)  Explanation of Discharge From Practice/Program: na     CCA Screening Triage Referral Assessment Type of Contact: Face-to-Face  Telemedicine Service Delivery:   Is this Initial or Reassessment?   Date Telepsych consult ordered in [facility]:    Time Telepsych consult ordered in [facility]:    Location of Assessment: [facility] Assessment Services  Provider Location: [facility] Assessment Services   Collateral Involvement: none allowed   Does Patient Have a Automotive engineer Guardian? No  Legal Guardian Contact Information: na  Copy of Legal Guardianship Form: No - copy requested  Legal Guardian Notified of Arrival: -- (na)  Legal Guardian Notified of Pending Discharge: -- (na)  If Minor and Not Living with Parent(s), Who has Custody? adult  Is CPS involved or ever been involved? -- (none reported)  Is APS involved or ever been involved? -- (none reported)   Patient Determined To Be At Risk for Harm To Self or Others Based on Review of Patient Reported Information or Presenting Complaint? No  Method: No Plan  Availability of Means: No access or NA  Intent: Vague intent or NA  Notification Required: No need or identified person  Additional Information for Danger to Others Potential: na Additional Comments for Danger to Others Potential: none  Are There Guns or Other Weapons in Your Home? No  Types of Guns/Weapons: na  Are These Weapons Safely Secured?                            -- (na)  Who Could Verify You Are Able To Have These Secured: na  Do You Have any  Outstanding Charges, Pending Court Dates, Parole/Probation? yes, Pt reported a current DUI with court date of 06/29/23. Pt also stated he is on probation. Pt stated he was in prison for 2 years and released 02/12/21.  Contacted To Inform of Risk of Harm To Self or Others: -- (na)    Does Patient Present under Involuntary Commitment? No    [place] of Residence:    Patient Currently Receiving the Following Services: Not Receiving Services   Determination of Need: Urgent (48 hours)   Options For Referral: Facility-Based Crisis     CCA Biopsychosocial Patient Reported Schizophrenia/Schizoaffective Diagnosis in Past: No   Strengths: accepting family support; willing to ask for help   Mental Health Symptoms Depression:   None   Duration of Depressive symptoms:    Mania:   None   Anxiety:    None   Psychosis:   None   Duration of Psychotic symptoms:    Trauma:   None   Obsessions:   None   Compulsions:  None   Inattention:   N/A   Hyperactivity/Impulsivity:   N/A   Oppositional/Defiant Behaviors:   N/A   Emotional Irregularity:   None   Other Mood/Personality Symptoms:   none observed    Mental Status Exam Appearance and self-care  Stature:   Average   Weight:   Thin   Clothing:   Casual; Disheveled   Grooming:   Neglected   Cosmetic use:   None   Posture/gait:   Normal   Motor activity:   Slowed   Sensorium  Attention:   Normal   Concentration:   Normal   Orientation:   X5   Recall/memory:   Normal   Affect and Mood  Affect:   Flat; Constricted   Mood:   Dysphoric; Pessimistic   Relating  Eye contact:   Normal   Facial expression:   Constricted   Attitude toward examiner:   Cooperative   Thought and Language  Speech flow:  Clear and Coherent; Paucity; Slow   Thought content:   Appropriate to Mood and Circumstances   Preoccupation:   Other (Comment) (shame of his relapse)    Hallucinations:   None   Organization:   Coherent; Intact   [employer] of Knowledge:   Average   Intelligence:   Average   Abstraction:   Functional   Judgement:   Fair   Dance movement psychotherapist:   Adequate   Insight:   Lacking; Flashes of insight   Decision Making:   Impulsive   Social Functioning  Social Maturity:   Impulsive   Social Judgement:   Heedless   Stress  Stressors:   Illness; Other (Comment); Family conflict (substance use and lack of direction for his life)   Coping Ability:   Exhausted   Skill Deficits:   Decision making; Self-control; Self-care; Responsibility   Supports:   Family; Friends/Service system; Support needed     Religion: Religion/Spirituality Are You A Religious Person?: Yes How Might This Affect Treatment?: "spiritual" not "religious"  Leisure/Recreation: Leisure / Recreation Do You Have Hobbies?: No  Exercise/Diet: Exercise/Diet Do You Exercise?: Yes What Type of Exercise Do You Do?: Run/Walk How Many Times a Week Do You Exercise?: 4-5 times a week Have You Gained or Lost A Significant Amount of Weight in the Past Six Months?: Yes-Lost Number of Pounds Lost?:  (unknown amount) Do You Follow a Special Diet?: No Do You Have Any Trouble Sleeping?: Yes (at times) Explanation of Sleeping Difficulties: Pt stated that he uses both "an upper" and "a downer." Pt stated he has trouble sleeping when using the stimulant.   CCA Employment/Education Employment/Work Situation: Employment / Work Situation Employment Situation: Unemployed Patient's Job has Been Impacted by Current Illness:  (na) Has Patient ever Been in the [employer]?: No  Education: Education Is Patient Currently Attending School?: No Last Grade Completed: 9 (GED) Did You Attend College?: No Did You Have An Individualized Education Program (IIEP): No Did You Have Any Difficulty At School?: Yes (attention) Were Any Medications Ever Prescribed  For These Difficulties?: No Patient's Education Has Been Impacted by Current Illness: No   CCA Family/Childhood History Family and Relationship History: Family history Marital status: Single Does patient have children?: No  Childhood History:  Childhood History By whom was/is the patient raised?: Mother/father and step-parent Did patient suffer any verbal/emotional/physical/sexual abuse as a child?: No Did patient suffer from severe childhood neglect?: No Has patient ever been sexually abused/assaulted/raped as an adolescent or adult?: No Was the patient  ever a victim of a crime or a disaster?: No Witnessed domestic violence?: No Has patient been affected by domestic violence as an adult?: No       CCA Substance Use Alcohol/Drug Use: Alcohol / Drug Use Pain Medications: See PTA Prescriptions: See PTA Over the Counter: See PTA History of alcohol / drug use?: Yes Longest period of sobriety (when/how long): 8 months until December 2023 whe he relapsed Negative Consequences of Use: Financial, Armed forces operational officer, Personal relationships, Work / Programmer, multimedia Withdrawal Symptoms: None (none reported) Substance #1 Name of Substance 1: methaphetamine 1 - Age of First Use: 27 1 - Amount (size/oz): .25 sm 1 - Frequency: daily 1 - Duration: ongoing 1 - Last Use / Amount: last night 1 - Method of Aquiring: unknown 1- Route of Use: snort, smoke and IV Substance #2 Name of Substance 2: Fentanyl 2 - Age of First Use: 27 2 - Amount (size/oz): 1/2 gm 2 - Frequency: daily 2 - Duration: ongoing 2 - Last Use / Amount: last night 2 - Method of Aquiring: unknown 2 - Route of Substance Use: snort and IV                     ASAM's:  Six Dimensions of Multidimensional Assessment  Dimension 1:  Acute Intoxication and/or Withdrawal Potential:   Dimension 1:  Description of individual's past and current experiences of substance use and withdrawal: none reported  Dimension 2:  Biomedical Conditions  and Complications:   Dimension 2:  Description of patient's biomedical conditions and  complications: none reported  Dimension 3:  Emotional, Behavioral, or Cognitive Conditions and Complications:  Dimension 3:  Description of emotional, behavioral, or cognitive conditions and complications: Hx of MDD and GAD  Dimension 4:  Readiness to Change:     Dimension 5:  Relapse, Continued use, or Continued Problem Potential:     Dimension 6:  Recovery/Living Environment:     ASAM Severity Score: ASAM's Severity Rating Score: 8  ASAM Recommended Level of Treatment: ASAM Recommended Level of Treatment: Level II Partial Hospitalization Treatment   Substance use Disorder (SUD) Substance Use Disorder (SUD)  Checklist Symptoms of Substance Use: Continued use despite having a persistent/recurrent physical/psychological problem caused/exacerbated by use, Continued use despite persistent or recurrent social, interpersonal problems, caused or exacerbated by use, Presence of craving or strong urge to use, Recurrent use that results in a failure to fulfill major role obligations (work, school, home)  Recommendations for Services/Supports/Treatments: Recommendations for Services/Supports/Treatments Recommendations For Services/Supports/Treatments: CD-IOP Intensive Chemical Dependency Program, IOP (Intensive Outpatient Program)  Discharge Disposition:    DSM5 Diagnoses: Patient Active Problem List   Diagnosis Date Noted   Substance induced mood disorder (HCC) 12/25/2016   Cocaine abuse (HCC) 12/25/2016   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

## 2023-07-29 ENCOUNTER — Other Ambulatory Visit: Payer: Self-pay

## 2023-07-29 ENCOUNTER — Emergency Department
Admission: EM | Admit: 2023-07-29 | Discharge: 2023-07-30 | Disposition: A | Discharge status: Home / Self Care | Payer: MEDICAID | Attending: Emergency Medicine | Admitting: Emergency Medicine

## 2023-07-29 ENCOUNTER — Emergency Department: Payer: MEDICAID

## 2023-07-29 DIAGNOSIS — Y92410 Unspecified street and highway as the place of occurrence of the external cause: Secondary | ICD-10-CM | POA: Insufficient documentation

## 2023-07-29 DIAGNOSIS — S73014A Posterior dislocation of right hip, initial encounter: Secondary | ICD-10-CM | POA: Insufficient documentation

## 2023-07-29 DIAGNOSIS — S79911A Unspecified injury of right hip, initial encounter: Secondary | ICD-10-CM | POA: Diagnosis present

## 2023-07-29 LAB — CBC WITH DIFFERENTIAL/PLATELET
Abs Immature Granulocytes: 0.04 10*3/uL (ref 0.00–0.07)
Basophils Absolute: 0.1 10*3/uL (ref 0.0–0.1)
Basophils Relative: 1 %
Eosinophils Absolute: 0.3 10*3/uL (ref 0.0–0.5)
Eosinophils Relative: 4 %
HCT: 35.8 % — ABNORMAL LOW (ref 39.0–52.0)
Hemoglobin: 11.9 g/dL — ABNORMAL LOW (ref 13.0–17.0)
Immature Granulocytes: 1 %
Lymphocytes Relative: 15 %
Lymphs Abs: 1.3 10*3/uL (ref 0.7–4.0)
MCH: 28.9 pg (ref 26.0–34.0)
MCHC: 33.2 g/dL (ref 30.0–36.0)
MCV: 86.9 fL (ref 80.0–100.0)
Monocytes Absolute: 0.5 10*3/uL (ref 0.1–1.0)
Monocytes Relative: 5 %
Neutro Abs: 6.5 10*3/uL (ref 1.7–7.7)
Neutrophils Relative %: 74 %
Platelets: 265 10*3/uL (ref 150–400)
RBC: 4.12 MIL/uL — ABNORMAL LOW (ref 4.22–5.81)
RDW: 12.7 % (ref 11.5–15.5)
WBC: 8.7 10*3/uL (ref 4.0–10.5)
nRBC: 0 % (ref 0.0–0.2)

## 2023-07-29 LAB — URINE DRUG SCREEN, QUALITATIVE (ARMC ONLY)
Amphetamines, Ur Screen: POSITIVE — AB
Barbiturates, Ur Screen: NOT DETECTED
Benzodiazepine, Ur Scrn: NOT DETECTED
Cannabinoid 50 Ng, Ur ~~LOC~~: NOT DETECTED
Cocaine Metabolite,Ur ~~LOC~~: POSITIVE — AB
MDMA (Ecstasy)Ur Screen: NOT DETECTED
Methadone Scn, Ur: NOT DETECTED
Opiate, Ur Screen: POSITIVE — AB
Phencyclidine (PCP) Ur S: NOT DETECTED
Tricyclic, Ur Screen: NOT DETECTED

## 2023-07-29 LAB — TYPE AND SCREEN
ABO/RH(D): B POS
Antibody Screen: NEGATIVE

## 2023-07-29 LAB — BASIC METABOLIC PANEL
Anion gap: 7 (ref 5–15)
BUN: 11 mg/dL (ref 6–20)
CO2: 25 mmol/L (ref 22–32)
Calcium: 8.8 mg/dL — ABNORMAL LOW (ref 8.9–10.3)
Chloride: 102 mmol/L (ref 98–111)
Creatinine, Ser: 0.76 mg/dL (ref 0.61–1.24)
GFR, Estimated: >60 mL/min (ref >60)
Glucose, Bld: 112 mg/dL — ABNORMAL HIGH (ref 70–99)
Potassium: 4.5 mmol/L (ref 3.5–5.1)
Sodium: 134 mmol/L — ABNORMAL LOW (ref 135–145)

## 2023-07-29 MED ORDER — PROPOFOL 10 MG/ML IV BOLUS
0.5000 mg/kg | Freq: Once | INTRAVENOUS | Status: AC
  Administered 2023-07-29: 31.8 mg via INTRAVENOUS
  Filled 2023-07-29: qty 20

## 2023-07-29 MED ORDER — HYDROMORPHONE HCL 1 MG/ML IJ SOLN
1.0000 mg | Freq: Once | INTRAMUSCULAR | Status: AC
  Administered 2023-07-29: 1 mg via INTRAVENOUS
  Filled 2023-07-29: qty 1

## 2023-07-29 MED ORDER — PROPOFOL 10 MG/ML IV BOLUS
1.0000 mg/kg | Freq: Once | INTRAVENOUS | Status: AC
  Administered 2023-07-30: 63.5 mg via INTRAVENOUS

## 2023-07-29 MED ORDER — PROPOFOL 10 MG/ML IV BOLUS
1.0000 mg/kg | Freq: Once | INTRAVENOUS | Status: AC
  Administered 2023-07-29: 63.5 mg via INTRAVENOUS
  Filled 2023-07-29: qty 20

## 2023-07-29 MED ORDER — PROPOFOL 10 MG/ML IV BOLUS: 0.5000 mg/kg | Freq: Once | INTRAVENOUS | Status: DC

## 2023-07-29 MED ORDER — PROPOFOL 10 MG/ML IV BOLUS
0.5000 mg/kg | Freq: Once | INTRAVENOUS | Status: DC
  Filled 2023-07-29: qty 20

## 2023-07-29 NOTE — Sedation Documentation (Signed)
X-ray at bedside
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
## 2023-07-30 ENCOUNTER — Emergency Department: Payer: MEDICAID

## 2023-07-30 NOTE — ED Notes (Signed)
Pt able to demonstrate proper use of crutches. Pt instructed to bare no weight on his right leg. Pt A&Ox4, verbalized understanding of d/c instructions and follow up care. Pt reports his friend will be driving him home.

## 2024-04-10 ENCOUNTER — Emergency Department
Admission: EM | Admit: 2024-04-10 | Discharge: 2024-04-10 | Disposition: A | Discharge status: Home / Self Care | Payer: MEDICAID | Attending: Emergency Medicine | Admitting: Emergency Medicine

## 2024-04-10 DIAGNOSIS — S31813A Puncture wound without foreign body of right buttock, initial encounter: Secondary | ICD-10-CM | POA: Insufficient documentation

## 2024-04-10 DIAGNOSIS — Z23 Encounter for immunization: Secondary | ICD-10-CM | POA: Insufficient documentation

## 2024-04-10 DIAGNOSIS — W268XXA Contact with other sharp object(s), not elsewhere classified, initial encounter: Secondary | ICD-10-CM | POA: Insufficient documentation

## 2024-04-10 MED ORDER — CEPHALEXIN 500 MG PO CAPS: 500.0000 mg | ORAL_CAPSULE | Freq: Four times a day (QID) | ORAL | 0 refills | Status: AC

## 2024-04-10 MED ORDER — LIDOCAINE-EPINEPHRINE (PF) 2 %-1:200000 IJ SOLN
20.0000 mL | Freq: Once | INTRAMUSCULAR | Status: AC
  Administered 2024-04-10: 20 mL via INTRADERMAL
  Filled 2024-04-10: qty 20

## 2024-04-10 MED ORDER — TETANUS-DIPHTH-ACELL PERTUSSIS 5-2.5-18.5 LF-MCG/0.5 IM SUSY
0.5000 mL | PREFILLED_SYRINGE | Freq: Once | INTRAMUSCULAR | Status: AC
  Administered 2024-04-10: 0.5 mL via INTRAMUSCULAR
  Filled 2024-04-10: qty 0.5

## 2024-04-10 NOTE — ED Provider Notes (Signed)
 [facility] Emergency Department Provider Note     Event Date/Time   First MD Initiated Contact with Patient 04/10/24 1815     (approximate)   History   Extremity Laceration   HPI  Steven Stark is a 38 y.o. male with a history of GERD, hepatitis C and substance abuse presents to the ED for evaluation of a puncture wound to the right buttock.  Patient reports he was working on a car when he fell backward onto a piece of sheet metal.  Tetanus not up-to-date.  Patient is ambulatory.  Sensation intact.  No other complaint.     Physical Exam   Triage Vital Signs: ED Triage Vitals  Encounter Vitals Group     BP 04/10/24 1807 126/87     Systolic BP Percentile --      Diastolic BP Percentile --      Pulse Rate 04/10/24 1807 (!) 110     Resp 04/10/24 1807 16     Temp 04/10/24 1807 99.2 F (37.3 C)     Temp Source 04/10/24 1807 Oral     SpO2 04/10/24 1807 96 %     Weight --      Height --      Head Circumference --      Peak Flow --      Pain Score 04/10/24 1806 2     Pain Loc --      Pain Education --      Exclude from Growth Chart --     Most recent vital signs: Vitals:   04/10/24 1807 04/10/24 1931  BP: 126/87 121/83  Pulse: (!) 110 95  Resp: 16 18  Temp: 99.2 F (37.3 C)   SpO2: 96% 97%    General Awake, no distress.  HEENT NCAT.  CV:  Good peripheral perfusion.  RESP:  Normal effort.  ABD:  No distention.  Other:  3 x 0.5 cm gaped open puncture wound to the right superior-lateral buttock.  Bleeding is controlled.  Sensation intact.  Normal gait.   ED Results / Procedures / Treatments   Labs (all labs ordered are listed, but only abnormal results are displayed) Labs Reviewed - No data to display  No results found.  PROCEDURES:  Critical Care performed: No  .Laceration Repair  Date/Time: 04/10/2024 7:44 PM  Performed by: Alvaro Augusta A, PA-C Authorized by: Alvaro Augusta A, PA-C   Consent:    Consent obtained:   Verbal   Consent given by:  Patient   Risks discussed:  Infection, pain, poor cosmetic result and nerve damage Anesthesia:    Anesthesia method:  Local infiltration   Local anesthetic:  Lidocaine  2% WITH epi Laceration details:    Location:  Pelvis   Pelvis location:  R buttock   Length (cm):  3   Depth (mm):  3 Treatment:    Area cleansed with:  Povidone-iodine and saline   Amount of cleaning:  Extensive   Irrigation solution:  Sterile saline   Irrigation method:  Pressure wash   Layers/structures repaired:  Deep subcutaneous Deep subcutaneous:    Suture size:  4-0   Suture material:  Monocryl   Suture technique:  Figure eight   Number of sutures:  2 Skin repair:    Repair method:  Sutures   Suture size:  5-0   Suture material:  Nylon   Suture technique:  Simple interrupted   Number of sutures:  3 Approximation:    Approximation:  Close Repair type:    Repair type:  Intermediate Post-procedure details:    Dressing: bandaid.   Procedure completion:  Tolerated    MEDICATIONS ORDERED IN ED: Medications  lidocaine -EPINEPHrine (XYLOCAINE  W/EPI) 2 %-1:200000 (PF) injection 20 mL (20 mLs Intradermal Given 04/10/24 1859)  Tdap (BOOSTRIX) injection 0.5 mL (0.5 mLs Intramuscular Given 04/10/24 1817)   IMPRESSION / MDM / ASSESSMENT AND PLAN / ED COURSE  I reviewed the triage vital signs and the nursing notes.                               38 y.o. male presents to the emergency department for evaluation and treatment of puncture wound. See HPI for further details.   Differential diagnosis includes, but is not limited to laceration, puncture wound, tetanus  Patient's presentation is most consistent with acute complicated illness / injury requiring diagnostic workup.  Patient is alert and oriented.  He is hemodynamically stable.  Initially tachycardic at 110 bpm but this improved during ED visit.  Physical exam findings are as stated above.  Please see procedure note for  laceration repair.  Patient tolerated well.  Tetanus administered and updated during this visit.  Patient stable condition for discharge home and encouraged to return to ED in 7 to 10 days for suture removal.  ED return precautions discussed.  Keflex prescription sent to pharmacy.  FINAL CLINICAL IMPRESSION(S) / ED DIAGNOSES   Final diagnoses:  Puncture wound of right buttock, initial encounter     Rx / DC Orders   ED Discharge Orders          Ordered    cephALEXin (KEFLEX) 500 MG capsule  4 times daily        04/10/24 1926            Note:  This document was prepared using Dragon voice recognition software and may include unintentional dictation errors.    Phyllis Breeze, Raphaella Larkin A, PA-C 04/10/24 1949    Claria Crofts, MD 04/11/24 [phone]

## 2024-04-10 NOTE — Discharge Instructions (Addendum)
 Keep sutures dry for first 24 hrs. Keep suture site of clean & dry. Gently use soap & water after first 24 hrs. DO NOT USE alcohol, hydrogen peroxide etc, to clean skin. You may cover the incision with clean gauze & replace it after your daily shower for your comfort.   Return to this ED in 7 to 10 days for suture removal.

## 2024-04-10 NOTE — ED Triage Notes (Signed)
 Pt states that he tripped while at work and landed on a piece of sheet metal that caused a laceration to his R buttock area. Pt states last tetanus was approximately 2012.

## 2024-06-23 ENCOUNTER — Emergency Department: Payer: MEDICAID

## 2024-06-23 ENCOUNTER — Emergency Department
Admission: EM | Admit: 2024-06-23 | Discharge: 2024-06-23 | Discharge status: Against Medical Advice | Payer: MEDICAID | Attending: Emergency Medicine | Admitting: Emergency Medicine

## 2024-06-23 ENCOUNTER — Other Ambulatory Visit: Payer: Self-pay

## 2024-06-23 DIAGNOSIS — F1990 Other psychoactive substance use, unspecified, uncomplicated: Secondary | ICD-10-CM | POA: Diagnosis not present

## 2024-06-23 DIAGNOSIS — Z5329 Procedure and treatment not carried out because of patient's decision for other reasons: Secondary | ICD-10-CM | POA: Insufficient documentation

## 2024-06-23 DIAGNOSIS — L03114 Cellulitis of left upper limb: Secondary | ICD-10-CM | POA: Insufficient documentation

## 2024-06-23 DIAGNOSIS — M7989 Other specified soft tissue disorders: Secondary | ICD-10-CM | POA: Diagnosis present

## 2024-06-23 LAB — CBC WITH DIFFERENTIAL/PLATELET
Abs Immature Granulocytes: 0.01 K/uL (ref 0.00–0.07)
Basophils Absolute: 0.1 K/uL (ref 0.0–0.1)
Basophils Relative: 1 %
Eosinophils Absolute: 0.2 K/uL (ref 0.0–0.5)
Eosinophils Relative: 3 %
HCT: 37.2 % — ABNORMAL LOW (ref 39.0–52.0)
Hemoglobin: 12.3 g/dL — ABNORMAL LOW (ref 13.0–17.0)
Immature Granulocytes: 0 %
Lymphocytes Relative: 22 %
Lymphs Abs: 1.7 K/uL (ref 0.7–4.0)
MCH: 29.9 pg (ref 26.0–34.0)
MCHC: 33.1 g/dL (ref 30.0–36.0)
MCV: 90.3 fL (ref 80.0–100.0)
Monocytes Absolute: 0.7 K/uL (ref 0.1–1.0)
Monocytes Relative: 9 %
Neutro Abs: 5.1 K/uL (ref 1.7–7.7)
Neutrophils Relative %: 65 %
Platelets: 373 K/uL (ref 150–400)
RBC: 4.12 MIL/uL — ABNORMAL LOW (ref 4.22–5.81)
RDW: 13.2 % (ref 11.5–15.5)
WBC: 7.7 K/uL (ref 4.0–10.5)
nRBC: 0 % (ref 0.0–0.2)

## 2024-06-23 LAB — COMPREHENSIVE METABOLIC PANEL WITH GFR
ALT: 14 U/L (ref 0–44)
AST: 20 U/L (ref 15–41)
Albumin: 4.1 g/dL (ref 3.5–5.0)
Alkaline Phosphatase: 76 U/L (ref 38–126)
Anion gap: 12 (ref 5–15)
BUN: 14 mg/dL (ref 6–20)
CO2: 23 mmol/L (ref 22–32)
Calcium: 8.7 mg/dL — ABNORMAL LOW (ref 8.9–10.3)
Chloride: 101 mmol/L (ref 98–111)
Creatinine, Ser: 0.82 mg/dL (ref 0.61–1.24)
GFR, Estimated: >60 mL/min (ref >60)
Glucose, Bld: 98 mg/dL (ref 70–99)
Potassium: 3.8 mmol/L (ref 3.5–5.1)
Sodium: 136 mmol/L (ref 135–145)
Total Bilirubin: 0.4 mg/dL (ref 0.0–1.2)
Total Protein: 7.3 g/dL (ref 6.5–8.1)

## 2024-06-23 MED ORDER — CEPHALEXIN 500 MG PO CAPS: 500.0000 mg | ORAL_CAPSULE | Freq: Four times a day (QID) | ORAL | 0 refills | Status: AC

## 2024-06-23 MED ORDER — ACETAMINOPHEN 500 MG PO TABS
1000.0000 mg | ORAL_TABLET | Freq: Once | ORAL | Status: AC
  Administered 2024-06-23: 1000 mg via ORAL
  Filled 2024-06-23: qty 2

## 2024-06-23 MED ORDER — DOXYCYCLINE HYCLATE 100 MG PO TABS
100.0000 mg | ORAL_TABLET | Freq: Once | ORAL | Status: AC
  Administered 2024-06-23: 100 mg via ORAL
  Filled 2024-06-23: qty 1

## 2024-06-23 MED ORDER — CEPHALEXIN 500 MG PO CAPS
500.0000 mg | ORAL_CAPSULE | Freq: Once | ORAL | Status: AC
  Administered 2024-06-23: 500 mg via ORAL
  Filled 2024-06-23: qty 1

## 2024-06-23 MED ORDER — DOXYCYCLINE HYCLATE 100 MG PO TABS: 100.0000 mg | ORAL_TABLET | Freq: Two times a day (BID) | ORAL | 0 refills | Status: AC

## 2024-06-23 NOTE — Discharge Instructions (Addendum)
 We have strongly recommended that you stay in the hospital for incision and drainage of this abscess, IV antibiotics, admission and potential surgical intervention.  You have decided to leave AGAINST MEDICAL ADVICE.  Please return to the emergency department if symptoms worsen or if it anytime you change your mind.  Please take your antibiotics until complete.   You may alternate over the counter Tylenol  1000 mg every 6 hours as needed for pain, fever and Ibuprofen  800 mg every 6-8 hours as needed for pain, fever.  Please take Ibuprofen  with food.  Do not take more than 4000 mg of Tylenol  (acetaminophen ) in a 24 hour period.

## 2024-06-23 NOTE — ED Triage Notes (Signed)
 Patient ambulatory to triage with complaints of insect bite to left pinky on the knuckle. Patient states unsure what bit him, happened a couple of days ago. Patient's hand is red and swollen, endorses some minor leakage.

## 2024-06-23 NOTE — ED Provider Notes (Signed)
 [facility] Provider Note    Event Date/Time   First MD Initiated Contact with Patient 06/23/24 0327     (approximate)   History   Insect Bite   HPI  Steven Stark is a 38 y.o. male with history of IV heroin use, hepatitis C who presents to the emergency department with complaints of abscess, swelling, pain to the left hand.  He initially states he thinks an insect may have bitten him but does not recall any bites or injury.  He denies injecting drugs recently and states he has been snorting them.  No fevers, chills.  No vomiting or diarrhea.  No history of diabetes.   History provided by patient.    Past Medical History:  Diagnosis Date   GERD (gastroesophageal reflux disease)    Hepatitis C    Restless leg syndrome    Substance abuse (HCC)     No past surgical history on file.  MEDICATIONS:  Prior to Admission medications   Medication Sig Start Date End Date Taking? Authorizing Provider  OLANZapine  (ZYPREXA ) 5 MG tablet Take 1 tablet (5 mg total) by mouth at bedtime. 09/29/22       Physical Exam   Triage Vital Signs: ED Triage Vitals  Encounter Vitals Group     BP 06/23/24 0013 (!) 163/74     Girls Systolic BP Percentile --      Girls Diastolic BP Percentile --      Boys Systolic BP Percentile --      Boys Diastolic BP Percentile --      Pulse Rate 06/23/24 0013 (!) 120     Resp 06/23/24 0013 18     Temp 06/23/24 0013 99 F (37.2 C)     Temp Source 06/23/24 0013 Oral     SpO2 06/23/24 0013 95 %     Weight 06/23/24 0014 141 lb 1.5 oz (64 kg)     Height 06/23/24 0014 6' 1 (1.854 m)     Head Circumference --      Peak Flow --      Pain Score 06/23/24 0014 8     Pain Loc --      Pain Education --      Exclude from Growth Chart --     Most recent vital signs: Vitals:   06/23/24 0013 06/23/24 0411  BP: (!) 163/74 122/84  Pulse: (!) 120 98  Resp: 18 17  Temp: 99 F (37.2 C) 98.6 F (37 C)  SpO2: 95% 100%     CONSTITUTIONAL: Alert, responds appropriately to questions. Well-appearing; well-nourished HEAD: Normocephalic, atraumatic EYES: Conjunctivae clear, pupils appear equal, sclera nonicteric ENT: normal nose; moist mucous membranes NECK: Supple, normal ROM CARD: RRR; S1 and S2 appreciated RESP: Normal chest excursion without splinting or tachypnea; breath sounds clear and equal bilaterally; no wheezes, no rhonchi, no rales, no hypoxia or respiratory distress, speaking full sentences ABD/GI: Non-distended; soft, non-tender, no rebound, no guarding, no peritoneal signs BACK: The back appears normal EXT: Patient has significant swelling, redness and warmth noted to the dorsal right hand with an area of fluctuance and open area with drainage of clear fluid from the proximal dorsal right fifth finger.  Normal capillary refill.  No tenderness of the flexor tendons and able to flex the fingers although some limited range of motion due to swelling.  2+ right radial pulse.  Compartments in the right arm are soft. SKIN: Normal color for age and race; warm; no rash on  exposed skin NEURO: Moves all extremities equally, normal speech PSYCH: The patient's mood and manner are appropriate.    Patient gave verbal permission to utilize photo for medical documentation only. The image was not stored on any personal device.  ED Results / Procedures / Treatments   LABS: (all labs ordered are listed, but only abnormal results are displayed) Labs Reviewed  COMPREHENSIVE METABOLIC PANEL WITH GFR - Abnormal; Notable for the following components:      Result Value   Calcium 8.7 (*)    All other components within normal limits  CBC WITH DIFFERENTIAL/PLATELET - Abnormal; Notable for the following components:   RBC 4.12 (*)    Hemoglobin 12.3 (*)    HCT 37.2 (*)    All other components within normal limits  AEROBIC CULTURE W GRAM STAIN (SUPERFICIAL SPECIMEN)     EKG:   RADIOLOGY: My personal review and  interpretation of imaging: X-ray shows no bony abnormality.  I have personally reviewed all radiology reports.   DG Hand Complete Left Result Date: 06/23/2024 CLINICAL DATA:  Swelling insect bite EXAM: LEFT HAND - COMPLETE 3+ VIEW COMPARISON:  None Available. FINDINGS: No fracture or malalignment. No radiopaque foreign body. Prominent soft tissue swelling at the dorsum of the hand and wrist, as well as involving the fifth digit. IMPRESSION: Soft tissue swelling without acute osseous abnormality. Electronically Signed   By: Luke Bun M.D.   On: 06/23/2024 00:39     PROCEDURES:  Critical Care performed: Yes, see critical care procedure note(s)   CRITICAL CARE Performed by: Josette Adelyna Brockman   Total critical care time: 30 minutes  Critical care time was exclusive of separately billable procedures and treating other patients.  Critical care was necessary to treat or prevent imminent or life-threatening deterioration.  Critical care was time spent personally by me on the following activities: development of treatment plan with patient and/or surrogate as well as nursing, discussions with consultants, evaluation of patient's response to treatment, examination of patient, obtaining history from patient or surrogate, ordering and performing treatments and interventions, ordering and review of laboratory studies, ordering and review of radiographic studies, pulse oximetry and re-evaluation of patient's condition.   Procedures    IMPRESSION / MDM / ASSESSMENT AND PLAN / ED COURSE  I reviewed the triage vital signs and the nursing notes.    Patient here with left hand cellulitis, abscess that is draining.  Temperature of 99 with heart rate of 120s on arrival.  The patient is on the cardiac monitor to evaluate for evidence of arrhythmia and/or significant heart rate changes.   DIFFERENTIAL DIAGNOSIS (includes but not limited to):   Hand cellulitis, abscess, no sign of compartment syndrome,  less likely flexor tenosynovitis given most of the redness, warmth and tenderness is over the dorsal hand.  Differential includes bacteremia, sepsis.   Patient's presentation is most consistent with acute presentation with potential threat to life or bodily function.   PLAN: Labs show no leukocytosis.  Normal creatinine, electrolytes.  Will obtain wound culture.  X-ray of the left hand reviewed and interpreted by myself and the radiologist and shows no bony abnormality or foreign body.  Have recommended incision and drainage of this area, IV antibiotics, blood cultures.  I feel he needs admission to the hospital.  Patient states that he has court in the morning and is unable to stay.  He also does not want to stay in the ED for I&D, further blood work or 1 dose of IV antibiotics.  He states he just wants oral antibiotics and discharge with prescriptions.  Have discussed with him at length that given the extent of his cellulitis, abnormal vital signs that have improved and concern for potential development of sepsis, his risk for bacteremia with history of IV drug use that he should stay for IV antibiotics and hospitalization as he may need multiple doses of IV antibiotics and possible surgical debridement.  He states that at this time he just cannot stay but states he will return after court for admission.  He does not appear intoxicated and appears to have capacity to make decisions for himself.  He is alert and oriented and understands risk of worsening symptoms, severe and permanent ability, even death if sepsis is not treated appropriately.  Have again urged him multiple times to stay for IV fluids, antibiotics, blood cultures but he declines.  I have even offered him a note for court..  Patient continues to decline further workup.  Will start him on Keflex  and doxycycline .  Will send wound culture.  Will have him sign out AGAINST MEDICAL ADVICE.   MEDICATIONS GIVEN IN ED: Medications  acetaminophen   (TYLENOL ) tablet 1,000 mg (1,000 mg Oral Given 06/23/24 0411)  doxycycline  (VIBRA -TABS) tablet 100 mg (100 mg Oral Given 06/23/24 0411)  cephALEXin  (KEFLEX ) capsule 500 mg (500 mg Oral Given 06/23/24 0411)     ED COURSE: Admission recommended but patient leaving AGAINST MEDICAL ADVICE.   CONSULTS: Admission recommended.   OUTSIDE RECORDS REVIEWED: Reviewed last orthopedic note in 2019.       FINAL CLINICAL IMPRESSION(S) / ED DIAGNOSES   Final diagnoses:  IVDU (intravenous drug user)  Cellulitis of left hand     Rx / DC Orders   ED Discharge Orders          Ordered    cephALEXin  (KEFLEX ) 500 MG capsule  4 times daily        06/23/24 0358    doxycycline  (VIBRA -TABS) 100 MG tablet  2 times daily        06/23/24 0358             Note:  This document was prepared using Dragon voice recognition software and may include unintentional dictation errors.   Melia Hopes, Josette SAILOR, DO 06/23/24 [phone]

## 2024-06-25 LAB — AEROBIC CULTURE W GRAM STAIN (SUPERFICIAL SPECIMEN): Gram Stain: NONE SEEN
